# Patient Record
Sex: Male | Born: 1970
Health system: Southern US, Community
[De-identification: ages and names within clinical notes are randomized; demographics above are authoritative.]

## PROBLEM LIST (undated history)

## (undated) DIAGNOSIS — S83206A Unspecified tear of unspecified meniscus, current injury, right knee, initial encounter: Secondary | ICD-10-CM

## (undated) DIAGNOSIS — E782 Mixed hyperlipidemia: Secondary | ICD-10-CM

## (undated) DIAGNOSIS — E119 Type 2 diabetes mellitus without complications: Secondary | ICD-10-CM

## (undated) DIAGNOSIS — N529 Male erectile dysfunction, unspecified: Secondary | ICD-10-CM

## (undated) DIAGNOSIS — Z973 Presence of spectacles and contact lenses: Secondary | ICD-10-CM

## (undated) DIAGNOSIS — E7800 Pure hypercholesterolemia, unspecified: Secondary | ICD-10-CM

## (undated) HISTORY — PX: VASECTOMY: SHX75

## (undated) HISTORY — PX: NO PAST SURGERIES: SHX2092

## (undated) HISTORY — DX: Type 2 diabetes mellitus without complications: E11.9

## (undated) HISTORY — PX: POLYPECTOMY: SHX149

---
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
## 2014-04-22 ENCOUNTER — Emergency Department (HOSPITAL_COMMUNITY): Payer: No Typology Code available for payment source

## 2014-04-22 ENCOUNTER — Encounter (HOSPITAL_COMMUNITY): Payer: Self-pay | Admitting: Emergency Medicine

## 2014-04-22 ENCOUNTER — Emergency Department (HOSPITAL_COMMUNITY)
Admission: EM | Admit: 2014-04-22 | Discharge: 2014-04-22 | Disposition: A | Discharge status: Home / Self Care | Payer: No Typology Code available for payment source | Attending: Emergency Medicine | Admitting: Emergency Medicine

## 2014-04-22 DIAGNOSIS — R0789 Other chest pain: Secondary | ICD-10-CM | POA: Diagnosis not present

## 2014-04-22 DIAGNOSIS — Z862 Personal history of diseases of the blood and blood-forming organs and certain disorders involving the immune mechanism: Secondary | ICD-10-CM | POA: Diagnosis not present

## 2014-04-22 DIAGNOSIS — R079 Chest pain, unspecified: Secondary | ICD-10-CM | POA: Insufficient documentation

## 2014-04-22 DIAGNOSIS — Z8639 Personal history of other endocrine, nutritional and metabolic disease: Secondary | ICD-10-CM | POA: Diagnosis not present

## 2014-04-22 HISTORY — DX: Pure hypercholesterolemia, unspecified: E78.00

## 2014-04-22 LAB — BASIC METABOLIC PANEL
Anion gap: 13 (ref 5–15)
BUN: 15 mg/dL (ref 6–23)
CO2: 25 mEq/L (ref 19–32)
Calcium: 9.6 mg/dL (ref 8.4–10.5)
Chloride: 106 mEq/L (ref 96–112)
Creatinine, Ser: 1.06 mg/dL (ref 0.50–1.35)
GFR calc Af Amer: >90 mL/min (ref >90)
GFR calc non Af Amer: 85 mL/min — ABNORMAL LOW (ref >90)
Glucose, Bld: 111 mg/dL — ABNORMAL HIGH (ref 70–99)
Potassium: 4.3 mEq/L (ref 3.7–5.3)
Sodium: 144 mEq/L (ref 137–147)

## 2014-04-22 LAB — CBC
HCT: 44.9 % (ref 39.0–52.0)
Hemoglobin: 15.4 g/dL (ref 13.0–17.0)
MCH: 29.2 pg (ref 26.0–34.0)
MCHC: 34.3 g/dL (ref 30.0–36.0)
MCV: 85.2 fL (ref 78.0–100.0)
Platelets: 250 10*3/uL (ref 150–400)
RBC: 5.27 MIL/uL (ref 4.22–5.81)
RDW: 13.7 % (ref 11.5–15.5)
WBC: 12 10*3/uL — ABNORMAL HIGH (ref 4.0–10.5)

## 2014-04-22 LAB — I-STAT TROPONIN, ED: Troponin i, poc: 0 ng/mL (ref 0.00–0.08)

## 2014-04-22 LAB — TROPONIN I: Troponin I: <0.3 ng/mL (ref <0.30)

## 2014-04-22 LAB — PRO B NATRIURETIC PEPTIDE: Pro B Natriuretic peptide (BNP): <5 pg/mL (ref 0–125)

## 2014-04-22 MED ORDER — KETOROLAC TROMETHAMINE 30 MG/ML IJ SOLN
30.0000 mg | Freq: Once | INTRAMUSCULAR | Status: AC
  Administered 2014-04-22: 30 mg via INTRAMUSCULAR
  Filled 2014-04-22: qty 1

## 2014-04-22 NOTE — ED Notes (Signed)
Pt A&OX4, ambulatory at d/c with steady gait, NAD 

## 2014-04-22 NOTE — ED Provider Notes (Signed)
CSN: [id number]     Arrival date & time 04/22/14  1321 History   First MD Initiated Contact with Patient 04/22/14 1808     Chief Complaint  Patient presents with  . Chest Pain     (Consider location/radiation/quality/duration/timing/severity/associated sxs/prior Treatment) HPI  43yM with CP. Onset around 1100 today while sitting at his desk. Constant since. Pressure/tightness in center of chest and radiates up into neck. No appreciable exacerbating or relieving factors. No respiratory complaints. No fever or chills. No unusual leg pain or swelling. N/v. No hx of similar symptoms. No known cad. Never had stress test. Nonsmoker. Denies drug use.   Past Medical History  Diagnosis Date  . Elevated cholesterol    History reviewed. No pertinent past surgical history. No family history on file. History  Substance Use Topics  . Smoking status: Never Smoker   . Smokeless tobacco: Not on file  . Alcohol Use: No    Review of Systems  All systems reviewed and negative, other than as noted in HPI.   Allergies  Review of patient's allergies indicates no known allergies.  Home Medications   Prior to Admission medications   Not on File   BP 125/75  Pulse 86  Temp(Src) 98.1 F (36.7 C) (Oral)  Resp 14  SpO2 96% Physical Exam  Nursing note and vitals reviewed. Constitutional: He appears well-developed and well-nourished. No distress.  HENT:  Head: Normocephalic and atraumatic.  Eyes: Conjunctivae are normal. Right eye exhibits no discharge. Left eye exhibits no discharge.  Neck: Neck supple.  Cardiovascular: Normal rate, regular rhythm and normal heart sounds.  Exam reveals no gallop and no friction rub.   No murmur heard. Pulmonary/Chest: Effort normal and breath sounds normal. No respiratory distress. He exhibits no tenderness.  Abdominal: Soft. He exhibits no distension. There is no tenderness.  Musculoskeletal: He exhibits no edema and no tenderness.  Lower extremities  symmetric as compared to each other. No calf tenderness. Negative Homan's. No palpable cords.   Neurological: He is alert.  Skin: Skin is warm and dry.  Psychiatric: He has a normal mood and affect. His behavior is normal. Thought content normal.    ED Course  Procedures (including critical care time) Labs Review Labs Reviewed  CBC - Abnormal; Notable for the following:    WBC 12.0 (*)    All other components within normal limits  BASIC METABOLIC PANEL - Abnormal; Notable for the following:    Glucose, Bld 111 (*)    GFR calc non Af Amer 85 (*)    All other components within normal limits  PRO B NATRIURETIC PEPTIDE  I-STAT TROPOININ, ED    Imaging Review Dg Chest 2 View  04/22/2014   CLINICAL DATA:  Chest heaviness and shortness of breath.  EXAM: CHEST  2 VIEW  COMPARISON:  None.  FINDINGS: Heart size and mediastinal contours are within normal limits. Both lungs are clear. Visualized skeletal structures are unremarkable.  IMPRESSION: Negative exam.   Electronically Signed   By: Inge Rise M.D.   On: 04/22/2014 14:10     EKG Interpretation   Date/Time:  Thursday April 22 2014 13:24:41 EDT Ventricular Rate:  87 PR Interval:  172 QRS Duration: 92 QT Interval:  338 QTC Calculation: 406 R Axis:   94 Text Interpretation:  Normal sinus rhythm Rightward axis Nonspecific ST  and T wave abnormality Abnormal ECG Confirmed by Wilson Singer  MD, Laisha Rau ([id number])  on 04/22/2014 6:12:33 PM      MDM  Final diagnoses:  Chest pain, unspecified chest pain type    43 year old male with chest pain. Mostly atypical symptoms. Pain into neck and just somewhat concerning, but otherwise symptoms are atypical. Onset at rest. Has  been constant since onset. Exacerbated by movement. No SOB, nausea, diaphoresis. EKG with nonspecific changes. No old for comparison. No known hx of CAD. No risk factors aside from HLD. HEART Score is two. Will check a second troponin. Doubt dissection or PE. CXR  clear. If second troponin, drown ~7 hours after symptoms onset is normal, plan DC with outpt FU for stress testing.     Virgel Manifold, MD 04/24/14 [phone]

## 2014-04-22 NOTE — ED Notes (Signed)
Updated pt; pt sts still having pain into neck; VS reeval

## 2014-04-22 NOTE — Discharge Instructions (Signed)
Chest Pain (Nonspecific) °It is often hard to give a specific diagnosis for the cause of chest pain. There is always a chance that your pain could be related to something serious, such as a heart attack or a blood clot in the lungs. You need to follow up with your health care provider for further evaluation. °CAUSES  °· Heartburn. °· Pneumonia or bronchitis. °· Anxiety or stress. °· Inflammation around your heart (pericarditis) or lung (pleuritis or pleurisy). °· A blood clot in the lung. °· A collapsed lung (pneumothorax). It can develop suddenly on its own (spontaneous pneumothorax) or from trauma to the chest. °· Shingles infection (herpes zoster virus). °The chest wall is composed of bones, muscles, and cartilage. Any of these can be the source of the pain. °· The bones can be bruised by injury. °· The muscles or cartilage can be strained by coughing or overwork. °· The cartilage can be affected by inflammation and become sore (costochondritis). °DIAGNOSIS  °Lab tests or other studies may be needed to find the cause of your pain. Your health care provider may have you take a test called an ambulatory electrocardiogram (ECG). An ECG records your heartbeat patterns over a 24-hour period. You may also have other tests, such as: °· Transthoracic echocardiogram (TTE). During echocardiography, sound waves are used to evaluate how blood flows through your heart. °· Transesophageal echocardiogram (TEE). °· Cardiac monitoring. This allows your health care provider to monitor your heart rate and rhythm in real time. °· Holter monitor. This is a portable device that records your heartbeat and can help diagnose heart arrhythmias. It allows your health care provider to track your heart activity for several days, if needed. °· Stress tests by exercise or by giving medicine that makes the heart beat faster. °TREATMENT  °· Treatment depends on what may be causing your chest pain. Treatment may include: °¨ Acid blockers for  heartburn. °¨ Anti-inflammatory medicine. °¨ Pain medicine for inflammatory conditions. °¨ Antibiotics if an infection is present. °· You may be advised to change lifestyle habits. This includes stopping smoking and avoiding alcohol, caffeine, and chocolate. °· You may be advised to keep your head raised (elevated) when sleeping. This reduces the chance of acid going backward from your stomach into your esophagus. °Most of the time, nonspecific chest pain will improve within 2-3 days with rest and mild pain medicine.  °HOME CARE INSTRUCTIONS  °· If antibiotics were prescribed, take them as directed. Finish them even if you start to feel better. °· For the next few days, avoid physical activities that bring on chest pain. Continue physical activities as directed. °· Do not use any tobacco products, including cigarettes, chewing tobacco, or electronic cigarettes. °· Avoid drinking alcohol. °· Only take medicine as directed by your health care provider. °· Follow your health care provider's suggestions for further testing if your chest pain does not go away. °· Keep any follow-up appointments you made. If you do not go to an appointment, you could develop lasting (chronic) problems with pain. If there is any problem keeping an appointment, call to reschedule. °SEEK MEDICAL CARE IF:  °· Your chest pain does not go away, even after treatment. °· You have a rash with blisters on your chest. °· You have a fever. °SEEK IMMEDIATE MEDICAL CARE IF:  °· You have increased chest pain or pain that spreads to your arm, neck, jaw, back, or abdomen. °· You have shortness of breath. °· You have an increasing cough, or you cough   up blood. °· You have severe back or abdominal pain. °· You feel nauseous or vomit. °· You have severe weakness. °· You faint. °· You have chills. °This is an emergency. Do not wait to see if the pain will go away. Get medical help at once. Call your local emergency services (911 in [place] Do not drive  yourself to the hospital. °MAKE SURE YOU:  °· Understand these instructions. °· Will watch your condition. °· Will get help right away if you are not doing well or get worse. °Document Released: 04/25/2005 Document Revised: 07/21/2013 Document Reviewed: 02/19/2008 °ExitCare® Patient Information ©2015 [employer]. This information is not intended to replace advice given to you by your health care provider. Make sure you discuss any questions you have with your health care provider. ° ° °Emergency Department Resource Guide °1) Find a Doctor and Pay Out of Pocket °Although you won't have to find out who is covered by your insurance plan, it is a good idea to ask around and get recommendations. You will then need to call the office and see if the doctor you have chosen will accept you as a new patient and what types of options they offer for patients who are self-pay. Some doctors offer discounts or will set up payment plans for their patients who do not have insurance, but you will need to ask so you aren't surprised when you get to your appointment. ° °2) Contact Your Local Health Department °Not all health departments have doctors that can see patients for sick visits, but many do, so it is worth a call to see if yours does. If you don't know where your local health department is, you can check in your phone book. The CDC also has a tool to help you locate your state's health department, and many state websites also have listings of all of their local health departments. ° °3) Find a Walk-in Clinic °If your illness is not likely to be very severe or complicated, you may want to try a walk in clinic. These are popping up all over the country in pharmacies, drugstores, and shopping centers. They're usually staffed by nurse practitioners or physician assistants that have been trained to treat common illnesses and complaints. They're usually fairly quick and inexpensive. However, if you have serious medical issues or  chronic medical problems, these are probably not your best option. ° °No Primary Care Doctor: °- Call [facility] at  832-8000 - they can help you locate a primary care doctor that  accepts your insurance, provides certain services, etc. °- Physician Referral Service- 1-800-533-3463 ° °Chronic Pain Problems: °Organization         Address  Phone   Notes  °[facility]  (336) 297-2271 Patients need to be referred by their primary care doctor.  ° °Medication Assistance: °Organization         Address  Phone   Notes  °[place] [facility] [address] °[place], [place] [address] (336) 641-8030 --Must be a resident of [place] °-- Must have NO insurance coverage whatsoever (no Medicaid/ Medicare, etc.) °-- The pt. MUST have a primary care doctor that directs their care regularly and follows them in the community °  °[facility]  (866) 331-1348   °[employer]  (888) 892-1162   ° °Agencies that provide inexpensive medical care: °Organization         Address  Phone   Notes  °[facility]  (  336) 832-8035   °[facility]    (336) 832-7272   °[facility] [address] °[place], [place] [address] (336) 832-4777   °[facility] [address], °[place] (336) 271-4999   °[facility]    (336) 373-0678   °[facility]    (336) 272-1050   °[facility] ° [address], [place] Phone:  (336) 832-4444, Fax:  (336) 832-4440 Hours of Operation:  9 am - 6 pm, M-F.  Also accepts Medicaid/Medicare and self-pay.  °[facility] ° [address], [place] Phone: (336) 832-3150, Fax: (336) 832-3151. Hours of Operation:  8:30 am - 5:30 pm, M-F.  Also accepts Medicaid and self-pay.  °[facility] [place] [address], [place] Phone: (336) 878-6027   °[facility] [address], [place], [name]  (336)723-1848, Ext. 123 Mondays & Thursdays: 7-9 AM.  First 15 patients are seen on a first come, first serve basis. °  ° °Medicaid-accepting [place] Providers: ° °Organization         Address  Phone   Notes  °[facility] [address], [place] (336) 641-2100 Also accepts self-pay patients.  °[facility] [address], [place] ° (336) 856-9996   °[facility] [address], [place] (336) 288-8857   °[facility] [address] [place] [address], [place] (336) 299-7000   °Veita Bland [address], [place]  ° (336) 373-1557 Only accepts [place] Access Medicaid patients after they have their name applied to their card.  ° °Self-Pay (no insurance) in [place]: ° °Organization         Address  Phone   Notes  °Sickle Cell Patients, [facility] [address], [place] (336) 832-1970   °[facility] [address], [place] (336) 832-4400   °[facility] ° [address], [place] (336) 992-4800   °[facility]/Dr. Osei-Bonsu ° [address] [place] [address], [place] or [address], [place] (336) 841-8500 Phone number for both [place] and [place] locations is the same.  °[facility] [address], [place] (336) 299-0000   °[facility] [address] [place] [address], [place] or [address] (336) 852-7530 °(336) 878-2260   °[facility] [address], [place] (336) 350-1642, phone; (336) 294-5005, fax Sees patients 1st and 3rd Saturday of every month.  Must not qualify for public or private insurance (i.e. Medicaid, Medicare, [place] Health Choice, Veterans' Benefits) • Household income should be no more than 200% of the poverty level •The clinic cannot treat you if you are pregnant or think you are pregnant • Sexually transmitted  diseases are not treated at the clinic.  ° ° °Dental Care: °Organization         Address  Phone  Notes  °[facility] [address], [place] (336) 641-6152 Accepts children up to age 21 who are enrolled in Medicaid or [place] Health Choice; pregnant women with a Medicaid card; and children who have applied for Medicaid or [place] Health Choice, but were declined, whose parents can pay a reduced fee at time of service.  °[facility] [place]    [address], [place] (336) 641-7733 Accepts children up to age 21 who are enrolled in Medicaid or [place] Health Choice; pregnant women with a Medicaid card; and children who have applied for Medicaid or [place] Health Choice, but were declined, whose parents can pay a reduced fee at time of service.  °[facility] ° [address], [place] (336) 641-4533 Patients are seen by appointment only. Walk-ins are not accepted. [facility] will see patients 18 years of age and older. °Monday - Tuesday (8am-5pm) °Most Wednesdays (8:30-5pm) °$30 per visit, cash only  °[facility] ° [address], [place] (336) 641-4533 Patients are seen by appointment only. Walk-ins are not accepted. [facility] will see patients 18 years of age and older. °One Wednesday Evening (Monthly: Volunteer Based).  $30 per visit, cash only  °[facility]  (919) 537-3737 for adults; Children under age 4, call Graduate Pediatric Dentistry at ([phone]) [phone]. Children aged 4-14, please call (919) [phone] to request a pediatric application. ° Dental services are provided in all areas of dental care including fillings, crowns and bridges, complete and partial dentures, implants, gum treatment, root canals, and extractions. Preventive care is also provided. Treatment is provided to both adults and children. °Patients are selected via a  lottery and there is often a waiting list. °  °[facility] [address], °[place] ° (336) 763-8833 [email or web address] °  °[facility] [address], [place], [place] (336)723-1848, Ext. 123 Second and Fourth Thursday of each month, opens at 6:30 AM; Clinic ends at 9 AM.  Patients are seen on a first-come first-served basis, and a limited number are seen during each clinic.  ° °[facility] ° [address], [place], [place] (336) 723-7904   Eligibility Requirements °You must have lived in [place], [place], or [place] counties for at least the last three months. °  You cannot be eligible for state or federal sponsored healthcare insurance, including Veterans Administration, Medicaid, or Medicare. °  You generally cannot be eligible for healthcare insurance through your employer.  °  How to apply: °Eligibility screenings are held every Tuesday and Wednesday afternoon from 1:00 pm until 4:00 pm. You do not need an appointment for the interview!  °[facility] [address], [place], [place] 336-631-2330   °[facility]  336-342-8273   °[facility]  336-703-3100   °[facility]  336-570-6415   ° °Behavioral Health Resources in the Community: °Intensive Outpatient Programs °Organization         Address  Phone  Notes  °[facility] [address], [place], [place] 336-878-6098   °[facility] [address], [place], [place] 336-832-9800   °[facility] [address], [place], [place] ° 336-882-2125   °[facility] [address],  °[place], [place] 1-800-853-5163 or 336-641-4981   °Substance Abuse Resources °Organization         Address  Phone  Notes  °[facility]  336-882-2125   °[facility]  336-784-9470   °[facility] [place] [facility]  336-285-9073   °[facility]  336-845-3988   °Residential &  Outpatient Substance Abuse Program  1-800-659-3381   °Psychological Services °Organization         Address  Phone  Notes  °[facility]  336- 832-9600   °  [facility]  336- 378-7881   °[facility] [address], [place] 1-800-853-5163 or 336-641-4981   ° °Mobile Crisis Teams °Organization         Address  Phone  Notes  °[facility], Mobile Crisis Care Unit  1-877-626-1772   °Assertive °[facility] ° [address] [place], [place] 336-834-9664   °Sharon DeEsch [address] °[place] 336-554-5454   ° °Self-Help/Support Groups °Organization         Address  Phone             Notes  °[facility] - variety of support groups  336- 373-1402 Call for more information  °Narcotics Anonymous (NA), [facility] [address], °[place]  2 meetings at this location  ° °Residential Treatment Programs °Organization         Address  Phone  Notes  °[facility] [address],    °[place]  1-866-801-8205   °[facility] ° [address], [place], [place] 704-293-8524   °[facility] [address], [place] 336-845-3988 Admissions: 8am-3pm M-F  °[facility] [address]    °[place], [place] 336-841-1104   °[facility] [address], [place], [place] 336-379-7146   °[facility] [address]  °[place], [place] 336-285-9073   °[facility] Intensive Outpatient [address], [place], [place] 336-852-3033   °[facility] [address]  °[place], [place] 1-877-615-2722 or 336-784-9470   °[facility] [address] [place], [place] 336-227-7417 Accepts Medicaid  °[facility] [address]  °[place] 1-800-659-3381 Substance Abuse/Addiction Treatment  ° °[place] Behavioral Health Resources °Organization          Address  Phone  Notes  °[facility]  (888) 581-9988   °Julie Brannon, PhD [address] [place], [place]   (336) 349-5553 or (336) 951-0000   °[facility]   [address] °[place], [place] (336) 349-4454   °[facility] [address], [place], [place] (336) 342-8316 Insurance/Medicaid/sponsorship through [facility]  °[facility] [address]                                    [place], [place] (336) 342-8316 Therapy/tele-psych/case  °[facility] [address]  ° [place], [place] (336) 349-2233    °Dr. Arfeen  (336) 349-4544   °[facility] [place]  [employer] [place] [facility] 1) [address], [place] °2) [address], [place] °3)  [address], [place] (336) 349-3220 °(336) 342-7768 ° °(336) 342-8140   °[place] [facility] (336) 342-1394 or (336) 342-3537 (After Hours)    ° ° ° °

## 2014-04-22 NOTE — ED Notes (Signed)
Pt states that he was sitting at his desk when he began having chest and jaw pain. Pt states that it feels like he has a "knot in the center of his chest" pain is worse with movement. Reports some dizziness.

## 2016-06-12 DIAGNOSIS — H35363 Drusen (degenerative) of macula, bilateral: Secondary | ICD-10-CM | POA: Diagnosis not present

## 2017-03-31 DIAGNOSIS — S6992XA Unspecified injury of left wrist, hand and finger(s), initial encounter: Secondary | ICD-10-CM | POA: Diagnosis not present

## 2017-03-31 DIAGNOSIS — M25532 Pain in left wrist: Secondary | ICD-10-CM | POA: Diagnosis not present

## 2017-03-31 DIAGNOSIS — S63502A Unspecified sprain of left wrist, initial encounter: Secondary | ICD-10-CM | POA: Diagnosis not present

## 2017-07-03 DIAGNOSIS — E782 Mixed hyperlipidemia: Secondary | ICD-10-CM | POA: Diagnosis not present

## 2017-07-03 DIAGNOSIS — E559 Vitamin D deficiency, unspecified: Secondary | ICD-10-CM | POA: Diagnosis not present

## 2017-07-15 DIAGNOSIS — H35363 Drusen (degenerative) of macula, bilateral: Secondary | ICD-10-CM | POA: Diagnosis not present

## 2017-10-11 ENCOUNTER — Telehealth: Payer: Self-pay | Admitting: General Practice

## 2017-10-11 NOTE — Telephone Encounter (Signed)
Called pt and LVM regarding his request to establish care with our practice. Informed him that we do have providers that are accepting new patients and advised that he call back to make a new patient appt.

## 2017-10-21 ENCOUNTER — Ambulatory Visit (INDEPENDENT_AMBULATORY_CARE_PROVIDER_SITE_OTHER): Payer: BLUE CROSS/BLUE SHIELD | Admitting: Family Medicine

## 2017-10-21 ENCOUNTER — Encounter: Payer: Self-pay | Admitting: Family Medicine

## 2017-10-21 VITALS — BP 104/68 | HR 73 | Temp 97.7°F | Ht 70.5 in | Wt 210.5 lb

## 2017-10-21 DIAGNOSIS — R1013 Epigastric pain: Secondary | ICD-10-CM

## 2017-10-21 MED ORDER — OMEPRAZOLE 20 MG PO CPDR: 20.0000 mg | DELAYED_RELEASE_CAPSULE | Freq: Two times a day (BID) | ORAL | 1 refills | Status: DC

## 2017-10-21 NOTE — Progress Notes (Signed)
Chief Complaint  Patient presents with  . Establish Care       New Patient Visit SUBJECTIVE: HPI: Jeremy Pierce is an 47 y.o.male who is being seen for establishing care.  The patient was previously seen at   Over past year or so, he has been having L sided discomfort under ribcage that radiates up and down. A/w belching. Describes as a "knot", not pain. Usually gets it after he eats or laying down. Does not have it right now. Denies nighttime awakenings, fevers, N/V/D, bleeding, or unintentional weight loss.Pt has not tried anything for this. Exertion does not affect this issue.   No Known Allergies  Past Medical History:  Diagnosis Date  . Elevated cholesterol    Past Surgical History:  Procedure Laterality Date  . NO PAST SURGERIES     Social History   Socioeconomic History  . Marital status: Divorced  Tobacco Use  . Smoking status: Former Research scientist (life sciences)  . Smokeless tobacco: Never Used  Substance and Sexual Activity  . Alcohol use: No    Comment: drinks beer--very seldom though  . Drug use: Never   Family History  Problem Relation Age of Onset  . Cancer Mother        breast cancer and tongue cancer  . Kidney disease Father   . Heart disease Father   . Cancer Sister        breast     Current Outpatient Medications:  Marland Kitchen  Multiple Vitamins-Minerals (MULTIVITAMIN WITH MINERALS) tablet, Take 1 tablet by mouth daily., Disp: , Rfl:  .  Omega-3 Fatty Acids (FISH OIL PO), Take 2 capsules by mouth daily., Disp: , Rfl:  .  Vitamin D, Ergocalciferol, (DRISDOL) 50000 UNITS CAPS capsule, Take 50,000 Units by mouth every 7 (seven) days. Mondays, Disp: , Rfl:  .  omeprazole (PRILOSEC) 20 MG capsule, Take 1 capsule (20 mg total) by mouth 2 (two) times daily before a meal., Disp: 60 capsule, Rfl: 1 .  simvastatin (ZOCOR) 20 MG tablet, Take 1 tablet (20 mg total) by mouth at bedtime., Disp: 90 tablet, Rfl: 3  ROS Const: Denies unintentional wt loss  GI: As noted in HPI    OBJECTIVE: BP 104/68 (BP Location: Left Arm, Patient Position: Sitting, Cuff Size: Large)   Pulse 73   Temp 97.7 F (36.5 C) (Oral)   Ht 5' 10.5" (1.791 m)   Wt 210 lb 8 oz (95.5 kg)   SpO2 96%   BMI 29.78 kg/m   Constitutional: -  VS reviewed -  Well developed, well nourished, appears stated age -  No apparent distress  Psychiatric: -  Oriented to person, place, and time -  Memory intact -  Affect and mood normal -  Fluent conversation, good eye contact -  Judgment and insight age appropriate  Eye: -  Conjunctivae clear, no discharge -  Pupils symmetric, round, reactive to light  ENMT: -  MMM    Pharynx moist, no exudate, no erythema  Neck: -  No gross swelling, no palpable masses -  Thyroid midline, not enlarged, mobile, no palpable masses  Cardiovascular: -  RRR -  No LE edema  Respiratory: -  Normal respiratory effort, no accessory muscle use, no retraction -  Breath sounds equal, no wheezes, no ronchi, no crackles  Gastrointestinal: -  Bowel sounds normal -  No tenderness, no distention, no guarding, no masses -  Neg Murphy's, Rovsing's, McBurnye's, Carnett's  Skin: -  No significant lesion on inspection -  Warm and  dry to palpation   ASSESSMENT/PLAN: Abdominal discomfort, epigastric - Plan: omeprazole (PRILOSEC) 20 MG capsule  Patient instructed to sign release of records form from his previous PCP. Trial 2 mo of PPI, if no improvement over next 3-4 weeks, will send MyChart message and I will refer to GI. Patient should return 3 mo for CPE. The patient voiced understanding and agreement to the plan.   [place], DO 10/21/17  11:02 AM

## 2017-10-21 NOTE — Patient Instructions (Addendum)
Take medicine for 60 days. Let me know in around 3 weeks if things are not improved. We will send you to a specialist at that point.  The only lifestyle changes that have data behind them are weight loss for the overweight/obese and elevating the head of the bed. Finding out which foods/positions are triggers is important.  Let us know if you need anything.

## 2017-10-21 NOTE — Progress Notes (Signed)
Pre visit review using our clinic review tool, if applicable. No additional management support is needed unless otherwise documented below in the visit note. 

## 2017-11-06 ENCOUNTER — Encounter: Payer: Self-pay | Admitting: Family Medicine

## 2017-11-09 ENCOUNTER — Other Ambulatory Visit: Payer: Self-pay | Admitting: Family Medicine

## 2017-11-09 MED ORDER — PANTOPRAZOLE SODIUM 40 MG PO TBEC: 40.0000 mg | DELAYED_RELEASE_TABLET | Freq: Every day | ORAL | 3 refills | Status: DC

## 2017-12-30 ENCOUNTER — Ambulatory Visit (INDEPENDENT_AMBULATORY_CARE_PROVIDER_SITE_OTHER): Payer: BLUE CROSS/BLUE SHIELD | Admitting: Family Medicine

## 2017-12-30 ENCOUNTER — Encounter: Payer: Self-pay | Admitting: Family Medicine

## 2017-12-30 VITALS — BP 108/68 | HR 67 | Temp 98.2°F | Ht 70.5 in | Wt 207.4 lb

## 2017-12-30 DIAGNOSIS — Z0000 Encounter for general adult medical examination without abnormal findings: Secondary | ICD-10-CM

## 2017-12-30 DIAGNOSIS — Z114 Encounter for screening for human immunodeficiency virus [HIV]: Secondary | ICD-10-CM | POA: Diagnosis not present

## 2017-12-30 DIAGNOSIS — Z125 Encounter for screening for malignant neoplasm of prostate: Secondary | ICD-10-CM

## 2017-12-30 DIAGNOSIS — L989 Disorder of the skin and subcutaneous tissue, unspecified: Secondary | ICD-10-CM | POA: Diagnosis not present

## 2017-12-30 LAB — COMPREHENSIVE METABOLIC PANEL
ALT: 30 U/L (ref 0–53)
AST: 20 U/L (ref 0–37)
Albumin: 4.5 g/dL (ref 3.5–5.2)
Alkaline Phosphatase: 58 U/L (ref 39–117)
BILIRUBIN TOTAL: 0.4 mg/dL (ref 0.2–1.2)
BUN: 17 mg/dL (ref 6–23)
CO2: 27 mEq/L (ref 19–32)
Calcium: 9.7 mg/dL (ref 8.4–10.5)
Chloride: 106 mEq/L (ref 96–112)
Creatinine, Ser: 1.27 mg/dL (ref 0.40–1.50)
GFR: 64.78 mL/min (ref >60.00)
GLUCOSE: 108 mg/dL — AB (ref 70–99)
Potassium: 4.7 mEq/L (ref 3.5–5.1)
Sodium: 140 mEq/L (ref 135–145)
Total Protein: 6.8 g/dL (ref 6.0–8.3)

## 2017-12-30 LAB — PSA: PSA: 0.52 ng/mL (ref 0.10–4.00)

## 2017-12-30 LAB — LIPID PANEL
Cholesterol: 155 mg/dL (ref 0–200)
HDL: 28.2 mg/dL — AB (ref >39.00)
NONHDL: 127.14
Total CHOL/HDL Ratio: 6
Triglycerides: 313 mg/dL — ABNORMAL HIGH (ref 0.0–149.0)
VLDL: 62.6 mg/dL — AB (ref 0.0–40.0)

## 2017-12-30 LAB — LDL CHOLESTEROL, DIRECT: Direct LDL: 78 mg/dL

## 2017-12-30 NOTE — Patient Instructions (Addendum)
Keep the diet clean and stay active.  2-3 days to get the results of your labs back.   Pepcid 20 mg is an alternative to Protonix and may be less strong.  Cancel appointment in 2 weeks if the skin lesion is doing better.  Let us know if you need anything.

## 2017-12-30 NOTE — Progress Notes (Signed)
Pre visit review using our clinic review tool, if applicable. No additional management support is needed unless otherwise documented below in the visit note. 

## 2017-12-30 NOTE — Progress Notes (Signed)
Chief Complaint  Patient presents with  . Annual Exam    Well Male Jeremy Pierce is here for a complete physical.   His last physical was >1 year ago.  Current diet: in general, a "healthy" diet.   Current exercise: [place] [employer] Weight trend: losing wt  steadily Does pt snore? Sometimes. Daytime fatigue? No. Seat belt? Yes.    Health maintenance Tetanus- Yes  HIV- No  Past Medical History:  Diagnosis Date  . Elevated cholesterol      Past Surgical History:  Procedure Laterality Date  . NO PAST SURGERIES      Medications  Current Outpatient Medications on File Prior to Visit  Medication Sig Dispense Refill  . Multiple Vitamins-Minerals (MULTIVITAMIN WITH MINERALS) tablet Take 1 tablet by mouth daily.    . Omega-3 Fatty Acids (FISH OIL PO) Take 2 capsules by mouth daily.    . pantoprazole (PROTONIX) 40 MG tablet Take 1 tablet (40 mg total) by mouth daily. 30 tablet 3  . simvastatin (ZOCOR) 20 MG tablet Take 1 tablet (20 mg total) by mouth at bedtime. 90 tablet 3  . Vitamin D, Ergocalciferol, (DRISDOL) 50000 UNITS CAPS capsule Take 50,000 Units by mouth every 7 (seven) days. Mondays     Allergies No Known Allergies  Family History Family History  Problem Relation Age of Onset  . Cancer Mother        breast cancer and tongue cancer  . Kidney disease Father   . Heart disease Father   . Cancer Sister        breast    Review of Systems: Constitutional: no fevers or chills Eye:  no recent significant change in vision- steadily losing vision, follows w [facility] Ear/Nose/Mouth/Throat:  Ears: +progressive hearing loss Nose/Mouth/Throat:  no complaints of nasal congestion, no sore throat Cardiovascular:  no chest pain, no palpitations Respiratory:  no cough and no shortness of breath Gastrointestinal:  no abdominal pain, no change in bowel habits GU:  Male: negative for dysuria, frequency, and incontinence and negative for prostate  symptoms Musculoskeletal/Extremities:  no pain, redness, or swelling of the joints Integumentary (Skin/Breast): +skin lesion on back (noticed for 4 days); otherwise no abnormal skin lesions reported Neurologic:  no headaches, no numbness, tingling Endocrine: No unexpected weight changes Hematologic/Lymphatic:  no night sweats  Exam BP 108/68 (BP Location: Left Arm, Patient Position: Sitting, Cuff Size: Large)   Pulse 67   Temp 98.2 F (36.8 C) (Oral)   Ht 5' 10.5" (1.791 m)   Wt 207 lb 6 oz (94.1 kg)   SpO2 95%   BMI 29.33 kg/m  General:  well developed, well nourished, in no apparent distress Skin: Center of thoracolumbar spine area, there is a 0.5 x 0.6 cm raised and thickened lesion with central ecchymosis; otherwise no significant moles, warts, or growths Head:  no masses, lesions, or tenderness Eyes:  pupils equal and round, sclera anicteric without injection Ears:  canals without lesions, TMs shiny without retraction, no obvious effusion, no erythema Nose:  nares patent, septum midline, mucosa normal Throat/Pharynx:  lips and gingiva without lesion; tongue and uvula midline; non-inflamed pharynx; no exudates or postnasal drainage Neck: neck supple without adenopathy, thyromegaly, or masses Lungs:  clear to auscultation, breath sounds equal bilaterally, no respiratory distress Cardio:  regular rate and rhythm, no bruits, no LE edema Abdomen:  abdomen soft, nontender; bowel sounds normal; no masses or organomegaly Genital (male): circumcised penis, no lesions or discharge; testes present bilaterally without masses or  tenderness Rectal: Deferred Musculoskeletal:  symmetrical muscle groups noted without atrophy or deformity Extremities:  no clubbing, cyanosis, or edema, no deformities, no skin discoloration Neuro:  gait normal; deep tendon reflexes normal and symmetric Psych: well oriented with normal range of affect and appropriate judgment/insight  Assessment and Plan  Well  adult exam - Plan: Comprehensive metabolic panel, Lipid panel  Screening for HIV (human immunodeficiency virus) - Plan: HIV antibody  Skin lesion  Screening for prostate cancer - Plan: PSA   Well 47 y.o. male. Counseled on diet and exercise. Other orders as above. Will request records from [facility].  Follow up in 6 mo for med check, 2 weeks for lesion removal. If it gets better, cancel appt. The patient voiced understanding and agreement to the plan.  [place], DO 12/30/17 10:40 AM

## 2017-12-31 ENCOUNTER — Encounter: Payer: Self-pay | Admitting: Family Medicine

## 2017-12-31 LAB — HIV ANTIBODY (ROUTINE TESTING W REFLEX): HIV: NONREACTIVE

## 2018-01-01 ENCOUNTER — Other Ambulatory Visit: Payer: Self-pay | Admitting: Family Medicine

## 2018-01-01 DIAGNOSIS — E785 Hyperlipidemia, unspecified: Secondary | ICD-10-CM

## 2018-01-01 DIAGNOSIS — R739 Hyperglycemia, unspecified: Secondary | ICD-10-CM

## 2018-01-03 ENCOUNTER — Other Ambulatory Visit: Payer: BLUE CROSS/BLUE SHIELD

## 2018-01-06 ENCOUNTER — Other Ambulatory Visit (INDEPENDENT_AMBULATORY_CARE_PROVIDER_SITE_OTHER): Payer: BLUE CROSS/BLUE SHIELD

## 2018-01-06 DIAGNOSIS — R739 Hyperglycemia, unspecified: Secondary | ICD-10-CM

## 2018-01-06 DIAGNOSIS — E785 Hyperlipidemia, unspecified: Secondary | ICD-10-CM

## 2018-01-06 LAB — LIPID PANEL
Cholesterol: 138 mg/dL (ref 0–200)
HDL: 27.8 mg/dL — ABNORMAL LOW (ref >39.00)
NONHDL: 110.58
Total CHOL/HDL Ratio: 5
Triglycerides: 206 mg/dL — ABNORMAL HIGH (ref 0.0–149.0)
VLDL: 41.2 mg/dL — ABNORMAL HIGH (ref 0.0–40.0)

## 2018-01-06 LAB — LDL CHOLESTEROL, DIRECT: Direct LDL: 79 mg/dL

## 2018-01-06 LAB — HEMOGLOBIN A1C: Hgb A1c MFr Bld: 6.4 % (ref 4.6–6.5)

## 2018-01-13 ENCOUNTER — Ambulatory Visit (INDEPENDENT_AMBULATORY_CARE_PROVIDER_SITE_OTHER): Payer: BLUE CROSS/BLUE SHIELD | Admitting: Family Medicine

## 2018-01-13 ENCOUNTER — Encounter: Payer: Self-pay | Admitting: Family Medicine

## 2018-01-13 VITALS — BP 108/62 | HR 76 | Temp 98.3°F | Ht 70.5 in | Wt 208.2 lb

## 2018-01-13 DIAGNOSIS — D489 Neoplasm of uncertain behavior, unspecified: Secondary | ICD-10-CM

## 2018-01-13 DIAGNOSIS — L820 Inflamed seborrheic keratosis: Secondary | ICD-10-CM | POA: Diagnosis not present

## 2018-01-13 MED ORDER — VITAMIN D (ERGOCALCIFEROL) 1.25 MG (50000 UNIT) PO CAPS
50000.0000 [IU] | ORAL_CAPSULE | ORAL | 0 refills | Status: DC
Start: 2018-01-13 — End: 2019-01-02

## 2018-01-13 MED ORDER — SIMVASTATIN 20 MG PO TABS: 20.0000 mg | ORAL_TABLET | Freq: Every day | ORAL | 3 refills | Status: DC

## 2018-01-13 NOTE — Progress Notes (Signed)
Pre visit review using our clinic review tool, if applicable. No additional management support is needed unless otherwise documented below in the visit note. 

## 2018-01-13 NOTE — Patient Instructions (Signed)
If your skin lesion is still there on your scalp in the next couple months, it needs to evaluated/come off.  Do not shower for the rest of the day. When you do wash it, use only soap and water. Do not vigorously scrub. Apply triple antibiotic ointment (like Neosporin) twice daily. Keep the area clean and dry.   Things to look out for: increasing pain not relieved by ibuprofen/acetaminophen, fevers, spreading redness, drainage of pus, or foul odor.  Give [place] 1 business week to get the results of your biopsy back.   Let us know if you need anything.

## 2018-01-13 NOTE — Progress Notes (Signed)
Chief Complaint  Patient presents with  . Procedure    Pt here for skin procedure. Noticed lesion on back, brought up at CPE. Has not changed, plan to remove. It scales over, improves and gets worse again.  BP 108/62 (BP Location: Left Arm, Patient Position: Sitting, Cuff Size: Large)   Pulse 76   Temp 98.3 F (36.8 C) (Oral)   Ht 5' 10.5" (1.791 m)   Wt 208 lb 4 oz (94.5 kg)   SpO2 96%   BMI 29.46 kg/m  Gen- awake, alert Skin- See below Psych- Age appropriate judgment and insight    Scalp    L thoracolumbar spine  Procedure note; shave biopsy (0.6 x 0.4 cm) Informed consent was obtained. The area was cleaned with alcohol and injected with 0.6 mL of 1% lidocaine with epinephrine. A Dermablade was slightly bent and used to cut under the area of interest. The specimen was placed in a sterile specimen cup and sent to the lab. The area was then cauterized ensuring adequate hemostasis. The area was dressed with triple antibiotic ointment and a bandage. There were no complications noted. The patient tolerated the procedure well.  Neoplasm of uncertain behavior - Plan: PR SHAV SKIN LES 0.6-1.0 CM TRUNK,ARM,LEG  Orders as above. Aftercare instructions verbalized and written. If lesion on scalp does not resolve in next couple mo, I want to see him or have him see a dermatologist. F/u as originally scheduled otherwise. Pt voiced understanding and agreement to the plan.  Jeremy Pierce 4:53 PM 01/13/18

## 2018-01-17 ENCOUNTER — Telehealth: Payer: Self-pay | Admitting: *Deleted

## 2018-01-17 NOTE — Telephone Encounter (Signed)
Received Dermatopathology Report results from [facility]; forwarded to provider/SLS 06/21

## 2018-02-07 ENCOUNTER — Ambulatory Visit (INDEPENDENT_AMBULATORY_CARE_PROVIDER_SITE_OTHER): Payer: BLUE CROSS/BLUE SHIELD | Admitting: Family Medicine

## 2018-02-07 ENCOUNTER — Encounter: Payer: Self-pay | Admitting: Family Medicine

## 2018-02-07 VITALS — BP 100/72 | HR 72 | Temp 98.1°F | Ht 70.5 in | Wt 209.0 lb

## 2018-02-07 DIAGNOSIS — N509 Disorder of male genital organs, unspecified: Secondary | ICD-10-CM | POA: Diagnosis not present

## 2018-02-07 DIAGNOSIS — N5089 Other specified disorders of the male genital organs: Secondary | ICD-10-CM

## 2018-02-07 NOTE — Progress Notes (Signed)
Chief Complaint  Patient presents with  . Testicle Pain    c/o hard lump on left testicle since this past Sunday.     Subjective: Patient is a 47 y.o. male here for lump on L testicle.  Noticed it 5 d ago. Might have gotten a little smaller today. No injury or pain. No urination issues. No hx of testicular cancer. He does have a hx of vasectomy.   ROS: GU: As noted in HPI  Past Medical History:  Diagnosis Date  . Elevated cholesterol     Objective: BP 100/72 (BP Location: Left Arm, Patient Position: Sitting, Cuff Size: Normal)   Pulse 72   Temp 98.1 F (36.7 C) (Oral)   Ht 5' 10.5" (1.791 m)   Wt 209 lb (94.8 kg)   SpO2 95%   BMI 29.56 kg/m  General: Awake, appears stated age Lungs: No accessory muscle use GU: Circumcised, R testicle normal, L testicle of normal contour and without ttp, epididymis without ttp, small approx 0.4 circular lesion, mild ttp over this area, no skin changes over scrotum Psych: Age appropriate judgment and insight, normal affect and mood  Assessment and Plan: Lump in the testicle - Plan: US Scrotum  CK [place]. F/u pending its results.  The patient voiced understanding and agreement to the plan.  [place], DO 02/07/18  8:37 AM

## 2018-02-07 NOTE — Patient Instructions (Signed)
Give a few business days to hear about your ultrasound appointment.   We will be in touch regarding the results.   Let us know if you need anything.

## 2018-02-10 ENCOUNTER — Ambulatory Visit (HOSPITAL_BASED_OUTPATIENT_CLINIC_OR_DEPARTMENT_OTHER)
Admission: RE | Admit: 2018-02-10 | Discharge: 2018-02-10 | Disposition: A | Discharge status: Home / Self Care | Payer: BLUE CROSS/BLUE SHIELD | Source: Ambulatory Visit | Attending: Family Medicine | Admitting: Family Medicine

## 2018-02-10 ENCOUNTER — Other Ambulatory Visit: Payer: Self-pay | Admitting: Family Medicine

## 2018-02-10 ENCOUNTER — Encounter: Payer: Self-pay | Admitting: Family Medicine

## 2018-02-10 ENCOUNTER — Encounter (HOSPITAL_BASED_OUTPATIENT_CLINIC_OR_DEPARTMENT_OTHER): Payer: Self-pay

## 2018-02-10 DIAGNOSIS — N503 Cyst of epididymis: Secondary | ICD-10-CM | POA: Diagnosis not present

## 2018-02-10 DIAGNOSIS — N433 Hydrocele, unspecified: Secondary | ICD-10-CM | POA: Diagnosis not present

## 2018-02-10 DIAGNOSIS — N509 Disorder of male genital organs, unspecified: Secondary | ICD-10-CM | POA: Diagnosis not present

## 2018-02-10 DIAGNOSIS — N5089 Other specified disorders of the male genital organs: Secondary | ICD-10-CM

## 2018-07-02 ENCOUNTER — Encounter: Payer: Self-pay | Admitting: Family Medicine

## 2018-07-02 ENCOUNTER — Ambulatory Visit (INDEPENDENT_AMBULATORY_CARE_PROVIDER_SITE_OTHER): Payer: BLUE CROSS/BLUE SHIELD | Admitting: Family Medicine

## 2018-07-02 VITALS — BP 110/68 | HR 76 | Temp 98.1°F | Ht 71.0 in | Wt 204.2 lb

## 2018-07-02 DIAGNOSIS — E785 Hyperlipidemia, unspecified: Secondary | ICD-10-CM | POA: Insufficient documentation

## 2018-07-02 DIAGNOSIS — S56911A Strain of unspecified muscles, fascia and tendons at forearm level, right arm, initial encounter: Secondary | ICD-10-CM | POA: Diagnosis not present

## 2018-07-02 DIAGNOSIS — E559 Vitamin D deficiency, unspecified: Secondary | ICD-10-CM

## 2018-07-02 LAB — LIPID PANEL
CHOLESTEROL: 163 mg/dL (ref 0–200)
HDL: 34.7 mg/dL — ABNORMAL LOW (ref >39.00)
LDL Cholesterol: 90 mg/dL (ref 0–99)
NonHDL: 128.02
Total CHOL/HDL Ratio: 5
Triglycerides: 188 mg/dL — ABNORMAL HIGH (ref 0.0–149.0)
VLDL: 37.6 mg/dL (ref 0.0–40.0)

## 2018-07-02 LAB — VITAMIN D 25 HYDROXY (VIT D DEFICIENCY, FRACTURES): VITD: 26.05 ng/mL — AB (ref 30.00–100.00)

## 2018-07-02 NOTE — Progress Notes (Signed)
Musculoskeletal Exam  Patient: Jeremy Pierce DOB: 24-May-1971  DOS: 07/02/2018  SUBJECTIVE:  Chief Complaint:   Chief Complaint  Patient presents with  . Follow-up    Jeremy Pierce is a 47 y.o.  male for evaluation and treatment of R forearm pain.   Onset:  8 months ago.  Was doing pullups.  Location: Medial elbow on R Character:  burning  Progression of issue:  is unchanged Associated symptoms: None Treatment: to date has been OTC NSAIDS, ointment.   Neurovascular symptoms: no  Hyperlipidemia Patient presents for dyslipidemia follow up. Currently being treated with Zocor 20 mg/d and compliance with treatment thus far has been good. He denies myalgias. He is adhering to a healthy. Exercise: Will sometimes go to gym, has been walking The patient is not known to have coexisting coronary artery disease.  Hx of Vit D def, has not taken over past couple weeks. Feels mood is better when he is taking it. No AE's.   ROS: Musculoskeletal/Extremities: +R elbow pain Neuro: No numbness/tingling  Past Medical History:  Diagnosis Date  . Elevated cholesterol     Objective: VITAL SIGNS: BP 110/68 (BP Location: Left Arm, Patient Position: Sitting, Cuff Size: Normal)   Pulse 76   Temp 98.1 F (36.7 C) (Oral)   Ht 5\' 11"  (1.803 m)   Wt 204 lb 4 oz (92.6 kg)   SpO2 98%   BMI 28.49 kg/m  Constitutional: Well formed, well developed. No acute distress. Cardiovascular: RRR Thorax & Lungs: No accessory muscle use, CTAB Musculoskeletal: R elbow.   Normal active range of motion: yes.   Normal passive range of motion: yes Tenderness to palpation: Yes, over medial and proximal forearm flexors Deformity: no Ecchymosis: no Neurologic: Normal sensory function. No focal deficits noted. DTR's equal and symmetry in UE's. No clonus. Psychiatric: Normal mood. Age appropriate judgment and insight. Alert & oriented x 3.    Assessment:  Hyperlipidemia, unspecified hyperlipidemia type -  Plan: Lipid panel  Vitamin D deficiency - Plan: Vitamin D (25 hydroxy)  Strain of right forearm, initial encounter  Plan: Ck above, cont Zocor. Ck Vit D. Strain of flexor, wrist brace. Heat, ice, stretches/exercises. F/u 6 mo for CPE, sooner if elbow area not improved, OT if no improvement. The patient voiced understanding and agreement to the plan.   [place], DO 07/02/18  10:34 AM

## 2018-07-02 NOTE — Patient Instructions (Addendum)
Give [place] 2-3 business days to get the results of your labs back.   Keep the diet clean and stay active.  Ice/cold pack over area for 10-15 min twice daily.  Heat (pad or rice pillow in microwave) over affected area, 10-15 minutes twice daily.   Golfer's Elbow Rehab It is normal to feel mild stretching, pulling, tightness, or discomfort as you do these exercises, but you should stop right away if you feel sudden pain or your pain gets worse.   Stretching and range of motion exercises These exercises warm up your muscles and joints and improve the movement and flexibility of your elbow. Exercise A: Wrist flexors  1. Straighten your left / right elbow in front of you with your palm up. If told by your health care provider, do this stretch with your elbow bent rather than straight. 2. With your other hand, gently pull your left / right hand and fingers toward you until you feel a gentle stretch on the top of your forearm. 3. Hold this position for 30 seconds. Repeat 2 times. Complete this exercise 3 times per week. Strengthening exercises These exercises build strength and endurance in your elbow. Endurance is the ability to use your muscles for a long time, even after several repetitions.  Exercise B: Wrist flexion  1. Sit with your left / right forearm palm-up and supported on a table or other surface. 2. Let your left / right wrist extend over the edge of the surface. 3. Hold a 3-5 lb weight or a piece of rubber exercise band or tubing. Slowly curl your hand up toward your forearm. Try to only move your hand and keep the rest of your arm still. 4. Hold this position for 3-5 seconds. 5. Slowly return to the starting position. Repeat 2 times. Complete this exercise 3 times per week. Exercise C: Eccentric wrist flexion 1. Sit with your left / right forearm palm-up and supported on a table or other surface. 2. Let your left / right wrist extend over the edge of the surface. 3. Hold a 3-5  lb weight or a piece of rubber exercise band or tubing. 4. Use your other hand to move your left / right hand up toward your forearm. 5. Slowly return to the starting position using only your left / right hand. Repeat 2 times. Complete this exercise 3 times per week. Exercise D: Hand turns, pronation i 1. Sit with your left / right forearm supported on a table or other surface. 2. Move your wrist so your pinkie travels toward your forearm and your thumb moves away from your forearm. 3. Hold this position for 3 seconds. 4. Slowly return to the starting position. Exercise E: Hand turns, pronation ii  1. Sit with your left / right forearm supported on a table or other surface. 2. Hold a hammer in your left / right hand. ? The exercise will be easier if you hold on near the head of the hammer. ? If you hold on toward the end of the handle, the exercise will be harder. 3. Rest your left / right hand over the edge of the table with your palm facing up. 4. Without moving your elbow, slowly turn your palm and your hand down toward the table. 5. Hold this position for 3 seconds. 6. Slowly return to the starting position. Repeat 2 times. Complete this exercise 3 times per week. Exercise F: Shoulder blade squeeze 1. Sit in a stable chair with good posture. Do not  let your back touch the back of the chair. 2. Your arms should be at your sides with your elbows bent. You can rest your forearms on a pillow. 3. Squeeze your shoulder blades together. Keep your shoulders level. Do not lift your shoulders up toward your ears. 4. Hold this position for 3 seconds. 5. Slowly release. Repeat 2 times. Complete this exercise 3 times per week. Make sure you discuss any questions you have with your health care provider. Document Released: 07/16/2005 Document Revised: 03/22/2016 Document Reviewed: 03/28/2015 Elsevier Interactive Patient Education  [employer].

## 2018-07-02 NOTE — Progress Notes (Signed)
Pre visit review using our clinic review tool, if applicable. No additional management support is needed unless otherwise documented below in the visit note. 

## 2018-07-28 ENCOUNTER — Encounter: Payer: Self-pay | Admitting: Family Medicine

## 2018-08-25 DIAGNOSIS — H35363 Drusen (degenerative) of macula, bilateral: Secondary | ICD-10-CM | POA: Diagnosis not present

## 2018-12-16 DIAGNOSIS — S62111A Displaced fracture of triquetrum [cuneiform] bone, right wrist, initial encounter for closed fracture: Secondary | ICD-10-CM | POA: Diagnosis not present

## 2018-12-18 DIAGNOSIS — S62111A Displaced fracture of triquetrum [cuneiform] bone, right wrist, initial encounter for closed fracture: Secondary | ICD-10-CM | POA: Diagnosis not present

## 2019-01-01 ENCOUNTER — Encounter: Payer: BLUE CROSS/BLUE SHIELD | Admitting: Family Medicine

## 2019-01-01 DIAGNOSIS — S62111A Displaced fracture of triquetrum [cuneiform] bone, right wrist, initial encounter for closed fracture: Secondary | ICD-10-CM | POA: Diagnosis not present

## 2019-01-01 DIAGNOSIS — S62111D Displaced fracture of triquetrum [cuneiform] bone, right wrist, subsequent encounter for fracture with routine healing: Secondary | ICD-10-CM | POA: Diagnosis not present

## 2019-01-02 ENCOUNTER — Ambulatory Visit (INDEPENDENT_AMBULATORY_CARE_PROVIDER_SITE_OTHER): Payer: BC Managed Care – PPO | Admitting: Family Medicine

## 2019-01-02 ENCOUNTER — Other Ambulatory Visit: Payer: Self-pay

## 2019-01-02 ENCOUNTER — Encounter: Payer: Self-pay | Admitting: Family Medicine

## 2019-01-02 VITALS — BP 108/64 | HR 72 | Temp 97.7°F | Ht 70.0 in | Wt 207.0 lb

## 2019-01-02 DIAGNOSIS — Z125 Encounter for screening for malignant neoplasm of prostate: Secondary | ICD-10-CM | POA: Diagnosis not present

## 2019-01-02 DIAGNOSIS — Z23 Encounter for immunization: Secondary | ICD-10-CM | POA: Diagnosis not present

## 2019-01-02 DIAGNOSIS — Z0000 Encounter for general adult medical examination without abnormal findings: Secondary | ICD-10-CM | POA: Diagnosis not present

## 2019-01-02 MED ORDER — SIMVASTATIN 20 MG PO TABS: 20.0000 mg | ORAL_TABLET | Freq: Every day | ORAL | 3 refills | Status: DC

## 2019-01-02 NOTE — Addendum Note (Signed)
Addended by: Sharon Seller B on: 01/02/2019 01:56 PM   Modules accepted: Orders

## 2019-01-02 NOTE — Patient Instructions (Signed)
Keep the diet clean and stay active.  Give us 2-3 business days to get the results of your labs back.   Let us know if you need anything.  

## 2019-01-02 NOTE — Progress Notes (Signed)
Chief Complaint  Patient presents with  . Annual Exam    Well Male Jeremy Pierce is here for a complete physical.   His last physical was >1 year ago.  Current diet: in general, a "pretty clean" diet.   Current exercise: walking, staying active Weight trend: stable Daytime fatigue? No. Seat belt? Yes.    Health maintenance Tetanus- No HIV- Yes  Past Medical History:  Diagnosis Date  . Elevated cholesterol      Past Surgical History:  Procedure Laterality Date  . NO PAST SURGERIES    . POLYPECTOMY     Urinary tract per patient  . VASECTOMY      Medications  Current Outpatient Medications on File Prior to Visit  Medication Sig Dispense Refill  . Multiple Vitamins-Minerals (MULTIVITAMIN WITH MINERALS) tablet Take 1 tablet by mouth daily.    . Omega-3 Fatty Acids (FISH OIL PO) Take 2 capsules by mouth daily.    . simvastatin (ZOCOR) 20 MG tablet Take 1 tablet (20 mg total) by mouth at bedtime. 90 tablet 3  . Vitamin D, Ergocalciferol, (DRISDOL) 50000 units CAPS capsule Take 1 capsule (50,000 Units total) by mouth every 7 (seven) days. Mondays (Patient not taking: Reported on 01/02/2019) 12 capsule 0   Allergies No Known Allergies  Family History Family History  Problem Relation Age of Onset  . Cancer Mother        breast cancer and tongue cancer  . Kidney disease Father   . Heart disease Father   . Cancer Sister        breast    Review of Systems: Constitutional: no fevers or chills Eye:  no recent significant change in vision Ear/Nose/Mouth/Throat:  Ears:  no tinnitus or hearing loss Nose/Mouth/Throat:  no complaints of nasal congestion, no sore throat Cardiovascular:  no chest pain, no palpitations Respiratory:  no cough and no shortness of breath Gastrointestinal:  no abdominal pain, no change in bowel habits GU:  Male: negative for dysuria, frequency, and incontinence and negative for prostate symptoms Musculoskeletal/Extremities:  no pain, redness, or  swelling of the joints Integumentary (Skin/Breast):  no abnormal skin lesions reported Neurologic:  no headaches, no numbness, tingling Endocrine: No unexpected weight changes Hematologic/Lymphatic:  no night sweats  Exam BP 108/64 (BP Location: Left Arm, Patient Position: Sitting, Cuff Size: Large)   Pulse 72   Temp 97.7 F (36.5 C) (Oral)   Ht 5\' 10"  (1.778 m)   Wt 207 lb (93.9 kg)   SpO2 98%   BMI 29.70 kg/m  General:  well developed, well nourished, in no apparent distress Skin:  no significant moles, warts, or growths Head:  no masses, lesions, or tenderness Eyes:  pupils equal and round, sclera anicteric without injection Ears:  canals without lesions, TMs shiny without retraction, no obvious effusion, no erythema Nose:  nares patent, septum midline, mucosa normal Throat/Pharynx:  lips and gingiva without lesion; tongue and uvula midline; non-inflamed pharynx; no exudates or postnasal drainage Neck: neck supple without adenopathy, thyromegaly, or masses Lungs:  clear to auscultation, breath sounds equal bilaterally, no respiratory distress Cardio:  regular rate and rhythm, no bruits, no LE edema Abdomen:  abdomen soft, nontender; bowel sounds normal; no masses or organomegaly Rectal: Deferred Musculoskeletal:  symmetrical muscle groups noted without atrophy or deformity Extremities:  no clubbing, cyanosis, or edema, no deformities, no skin discoloration Neuro:  gait normal; deep tendon reflexes normal and symmetric Psych: well oriented with normal range of affect and appropriate judgment/insight  Assessment  and Plan  Well adult exam - Plan: Comprehensive metabolic panel, Lipid panel, Vitamin D (25 hydroxy)  Screening PSA (prostate specific antigen) - Plan: PSA   Well 48 y.o. male. Counseled on diet and exercise. Other orders as above. Tdap today. Follow up in 6 mo pending the above workup. The patient voiced understanding and agreement to the plan.  [place], DO 01/02/19 1:54 PM

## 2019-01-02 NOTE — Addendum Note (Signed)
Addended by: Kelle Darting A on: 01/02/2019 02:01 PM   Modules accepted: Orders

## 2019-01-03 LAB — LIPID PANEL
Cholesterol: 159 mg/dL (ref <200)
HDL: 37 mg/dL — ABNORMAL LOW (ref >=40)
LDL Cholesterol (Calc): 93 mg/dL (calc)
Non-HDL Cholesterol (Calc): 122 mg/dL (calc) (ref <130)
Total CHOL/HDL Ratio: 4.3 (calc) (ref <5.0)
Triglycerides: 202 mg/dL — ABNORMAL HIGH (ref <150)

## 2019-01-03 LAB — PSA: PSA: 0.4 ng/mL (ref <=4.0)

## 2019-01-03 LAB — COMPREHENSIVE METABOLIC PANEL
AG Ratio: 1.9 (calc) (ref 1.0–2.5)
ALT: 30 U/L (ref 9–46)
AST: 20 U/L (ref 10–40)
Albumin: 4.7 g/dL (ref 3.6–5.1)
Alkaline phosphatase (APISO): 62 U/L (ref 36–130)
BUN: 13 mg/dL (ref 7–25)
CO2: 21 mmol/L (ref 20–32)
Calcium: 9.8 mg/dL (ref 8.6–10.3)
Chloride: 106 mmol/L (ref 98–110)
Creat: 1.17 mg/dL (ref 0.60–1.35)
Globulin: 2.5 g/dL (calc) (ref 1.9–3.7)
Glucose, Bld: 96 mg/dL (ref 65–99)
Potassium: 4.2 mmol/L (ref 3.5–5.3)
Sodium: 139 mmol/L (ref 135–146)
Total Bilirubin: 0.5 mg/dL (ref 0.2–1.2)
Total Protein: 7.2 g/dL (ref 6.1–8.1)

## 2019-01-03 LAB — VITAMIN D 25 HYDROXY (VIT D DEFICIENCY, FRACTURES): Vit D, 25-Hydroxy: 26 ng/mL — ABNORMAL LOW (ref 30–100)

## 2019-01-29 DIAGNOSIS — S62111D Displaced fracture of triquetrum [cuneiform] bone, right wrist, subsequent encounter for fracture with routine healing: Secondary | ICD-10-CM | POA: Diagnosis not present

## 2019-02-13 ENCOUNTER — Other Ambulatory Visit: Payer: Self-pay | Admitting: Family Medicine

## 2019-04-20 ENCOUNTER — Encounter: Payer: Self-pay | Admitting: Family Medicine

## 2019-04-22 ENCOUNTER — Other Ambulatory Visit: Payer: Self-pay

## 2019-04-22 ENCOUNTER — Encounter: Payer: Self-pay | Admitting: Family Medicine

## 2019-04-22 ENCOUNTER — Ambulatory Visit (INDEPENDENT_AMBULATORY_CARE_PROVIDER_SITE_OTHER): Payer: BC Managed Care – PPO | Admitting: Family Medicine

## 2019-04-22 VITALS — BP 106/76 | HR 75 | Temp 97.5°F | Ht 70.5 in | Wt 207.2 lb

## 2019-04-22 DIAGNOSIS — R000 Tachycardia, unspecified: Secondary | ICD-10-CM | POA: Diagnosis not present

## 2019-04-22 DIAGNOSIS — N529 Male erectile dysfunction, unspecified: Secondary | ICD-10-CM | POA: Diagnosis not present

## 2019-04-22 LAB — COMPREHENSIVE METABOLIC PANEL
ALT: 29 U/L (ref 0–53)
AST: 24 U/L (ref 0–37)
Albumin: 4.5 g/dL (ref 3.5–5.2)
Alkaline Phosphatase: 56 U/L (ref 39–117)
BUN: 13 mg/dL (ref 6–23)
CO2: 27 mEq/L (ref 19–32)
Calcium: 9.8 mg/dL (ref 8.4–10.5)
Chloride: 102 mEq/L (ref 96–112)
Creatinine, Ser: 1.03 mg/dL (ref 0.40–1.50)
GFR: 77.17 mL/min (ref >60.00)
Glucose, Bld: 128 mg/dL — ABNORMAL HIGH (ref 70–99)
Potassium: 4.1 mEq/L (ref 3.5–5.1)
Sodium: 137 mEq/L (ref 135–145)
Total Bilirubin: 0.6 mg/dL (ref 0.2–1.2)
Total Protein: 7 g/dL (ref 6.0–8.3)

## 2019-04-22 LAB — CBC
HCT: 44.8 % (ref 39.0–52.0)
Hemoglobin: 14.9 g/dL (ref 13.0–17.0)
MCHC: 33.2 g/dL (ref 30.0–36.0)
MCV: 86.6 fl (ref 78.0–100.0)
Platelets: 231 10*3/uL (ref 150.0–400.0)
RBC: 5.17 Mil/uL (ref 4.22–5.81)
RDW: 14 % (ref 11.5–15.5)
WBC: 6.8 10*3/uL (ref 4.0–10.5)

## 2019-04-22 LAB — TSH: TSH: 1.23 u[IU]/mL (ref 0.35–4.50)

## 2019-04-22 NOTE — Progress Notes (Signed)
Chief Complaint  Patient presents with  . Tachycardia    sunday night    Subjective: Patient is a 48 y.o. male here for increased HR.  3 days ago, the patient started experiencing around 5 minutes of his heart rate around 130.  This is never happened before.  He was drying off after getting out of the shower.  He reports being tired prior to this happening.  He did not feel lightheaded, dizzy, have any vision changes, chest pain, or shortness of breath.  He felt ill for the next 10 minutes but recovered well after that.  He has not had any issues since then.  No prior history of arrhythmia.  He had Pepsi at dinner, no alcohol.  No other illicit drugs.  No recent medication changes.  Patient continues have issues with maintaining an erection.  This is been going on for months.  He has never tried any medication for this and is unsure if he wishes to.  Libido is still sound for his age.   ROS: Heart: Denies chest pain  Lungs: Denies SOB   Past Medical History:  Diagnosis Date  . Elevated cholesterol     Objective: BP 106/76 (BP Location: Left Arm, Patient Position: Sitting, Cuff Size: Normal)   Pulse 75   Temp (!) 97.5 F (36.4 C) (Temporal)   Ht 5' 10.5" (1.791 m)   Wt 207 lb 4 oz (94 kg)   SpO2 98%   BMI 29.32 kg/m  General: Awake, appears stated age HEENT: MMM, EOMi Heart: RRR, no lower extremity edema Lungs: CTAB, no rales, wheezes or rhonchi. No accessory muscle use Psych: Age appropriate judgment and insight, normal affect and mood  Assessment and Plan: Tachycardia - Plan: EKG 12-Lead, CBC, Comprehensive metabolic panel, TSH  Erectile dysfunction, unspecified erectile dysfunction type  1-check EKG, labs. EKG shows NSR. No signs of ischemia. If this happens again, will consider Holter monitor. 2-offered medication, if he is interested, recommended calling insurance company to see what they will cover. Follow-up as originally scheduled. The patient voiced  understanding and agreement to the plan.  [place], DO 04/22/19  10:56 AM

## 2019-04-22 NOTE — Patient Instructions (Addendum)
Give Jeremy Pierce 2-3 business days to get the results of your labs back.   Stay hydrated.   EKG looks good. If the labs are normal and this happens again, we will set you up with a Holter monitor again.  If interested, call your insurance company to see what erectile dysfunction medications they wil cover. Looking for manufacturer coupons online is also reasonable.   Let Jeremy Pierce know if you need anything.

## 2019-04-24 ENCOUNTER — Ambulatory Visit: Payer: BC Managed Care – PPO | Admitting: Family Medicine

## 2019-05-05 ENCOUNTER — Encounter: Payer: Self-pay | Admitting: Family Medicine

## 2019-05-06 ENCOUNTER — Other Ambulatory Visit: Payer: Self-pay | Admitting: Family Medicine

## 2019-05-06 MED ORDER — SILDENAFIL CITRATE 100 MG PO TABS: 50.0000 mg | ORAL_TABLET | Freq: Every day | ORAL | 5 refills | Status: DC | PRN

## 2019-07-06 ENCOUNTER — Other Ambulatory Visit: Payer: Self-pay

## 2019-07-07 ENCOUNTER — Other Ambulatory Visit: Payer: Self-pay | Admitting: Family Medicine

## 2019-07-07 ENCOUNTER — Ambulatory Visit (INDEPENDENT_AMBULATORY_CARE_PROVIDER_SITE_OTHER): Payer: BC Managed Care – PPO | Admitting: Family Medicine

## 2019-07-07 ENCOUNTER — Encounter: Payer: Self-pay | Admitting: Family Medicine

## 2019-07-07 ENCOUNTER — Encounter: Payer: BC Managed Care – PPO | Admitting: Family Medicine

## 2019-07-07 VITALS — BP 104/64 | HR 75 | Temp 96.0°F | Ht 71.0 in | Wt 207.0 lb

## 2019-07-07 DIAGNOSIS — Z23 Encounter for immunization: Secondary | ICD-10-CM | POA: Diagnosis not present

## 2019-07-07 DIAGNOSIS — E1165 Type 2 diabetes mellitus with hyperglycemia: Secondary | ICD-10-CM | POA: Diagnosis not present

## 2019-07-07 DIAGNOSIS — E785 Hyperlipidemia, unspecified: Secondary | ICD-10-CM

## 2019-07-07 DIAGNOSIS — R7303 Prediabetes: Secondary | ICD-10-CM | POA: Diagnosis not present

## 2019-07-07 LAB — HEMOGLOBIN A1C: Hgb A1c MFr Bld: 6.8 % — ABNORMAL HIGH (ref 4.6–6.5)

## 2019-07-07 LAB — LIPID PANEL
Cholesterol: 165 mg/dL (ref 0–200)
HDL: 30.9 mg/dL — ABNORMAL LOW (ref >39.00)
NonHDL: 134.42
Total CHOL/HDL Ratio: 5
Triglycerides: 356 mg/dL — ABNORMAL HIGH (ref 0.0–149.0)
VLDL: 71.2 mg/dL — ABNORMAL HIGH (ref 0.0–40.0)

## 2019-07-07 LAB — LDL CHOLESTEROL, DIRECT: Direct LDL: 79 mg/dL

## 2019-07-07 NOTE — Progress Notes (Addendum)
Chief Complaint  Patient presents with  . Follow-up    Subjective: Hyperlipidemia Patient presents for Hyperlipidemia follow up. Currently taking Zocor 20 mg/d and compliance with treatment thus far has been good. He denies myalgias. He is usually adhering to a healthy diet. Exercise: walking The patient is not known to have coexisting coronary artery disease.  Prediabetes Pt had elevated sugar. Was told to clean up diet. It is looking like that did not happen during the entire time since his last visit. He is walking. He does have a famhx of DM.   Addendum: I received the patient's labs and he agreed to schedule a telephone encounter.  I verified his identity with 2 identifiers and he consented to proceed.  His A1c is 6.8.  He does have a family history of diabetes.  He is on Zocor.  He follows with an eye doctor yearly and his appointment is coming up next month.  He has never had a pneumonia vaccine.  He got the flu shot today.  ROS: Heart: Denies chest pain Lungs: Denies SOB  Past Medical History:  Diagnosis Date  . Elevated cholesterol     Objective: BP 104/64 (BP Location: Left Arm, Patient Position: Sitting, Cuff Size: Normal)   Pulse 75   Temp (!) 96 F (35.6 C) (Temporal)   Ht 5\' 11"  (1.803 m)   Wt 207 lb (93.9 kg)   SpO2 97%   BMI 28.87 kg/m  General: Awake, appears stated age HEENT: MMM Heart: RRR, no LE edema, no bruits Lungs: CTAB, no rales, wheezes or rhonchi. No accessory muscle use Psych: Age appropriate judgment and insight, normal affect and mood  Assessment and Plan: Hyperlipidemia, unspecified hyperlipidemia type - Plan: Lipid Profile  Need for influenza vaccination - Plan: Flu Vaccine QUAD 6+ mos PF IM (Fluarix Quad PF)  Prediabetes - Plan: HgB A1c  Type 2 diabetes mellitus with hyperglycemia, without long-term current use of insulin (HCC)  1- Cont Zocor. Counseled on diet and exercise. 2- Hopefully his A1c has not increased. F/u in 6 mo  pending A1c.  The patient voiced understanding and agreement to the plan.  Addendum: Counseled on prognosis and management of diabetes.  I do not believe we need to add any oral hypoglycemics and certainly not insulin at this time.  Counseled on diet and exercise.  I will have our office reach out to him to check a microalbumin creatinine ratio in addition to giving him his pneumonia shot.  We will re-vaccinate in approximately 7-10 years.  He has an eye appointment next month.  He will let them know he has diabetes and have them send their office note here.  I will do a foot exam at his next visit in 6 months.  He will contact his insurance company and find out which close meter they will cover and let us know.  [place], DO 07/07/19  4:21 PM

## 2019-07-07 NOTE — Addendum Note (Signed)
Addended by: Ames Coupe on: 07/07/2019 04:27 PM   Modules accepted: Orders, Level of Service

## 2019-07-07 NOTE — Patient Instructions (Signed)
Give us 2-3 business days to get the results of your labs back.   Keep the diet clean and stay active.  Let us know if you need anything. 

## 2019-07-08 NOTE — Progress Notes (Signed)
Erroneous encounter, please disregard

## 2019-07-27 ENCOUNTER — Other Ambulatory Visit: Payer: Self-pay

## 2019-07-27 ENCOUNTER — Encounter (HOSPITAL_COMMUNITY): Payer: Self-pay

## 2019-07-27 ENCOUNTER — Ambulatory Visit (HOSPITAL_COMMUNITY)
Admission: EM | Admit: 2019-07-27 | Discharge: 2019-07-27 | Disposition: A | Discharge status: Home / Self Care | Payer: BC Managed Care – PPO | Attending: Family Medicine | Admitting: Family Medicine

## 2019-07-27 DIAGNOSIS — N451 Epididymitis: Secondary | ICD-10-CM | POA: Diagnosis not present

## 2019-07-27 MED ORDER — DOXYCYCLINE HYCLATE 100 MG PO CAPS: 100.0000 mg | ORAL_CAPSULE | Freq: Two times a day (BID) | ORAL | 0 refills | Status: DC

## 2019-07-27 NOTE — Discharge Instructions (Addendum)
Take antibiotic as directed Follow up PCP if you fail to improve

## 2019-07-27 NOTE — ED Triage Notes (Signed)
Pt states he was moving some logs on Saturday. Pt states he has left testicle pain. Pt thinks he might has a Herina. ( Pt states she has some swelling)

## 2019-07-27 NOTE — ED Provider Notes (Signed)
[place]    CSN: [id number] Arrival date & time: 07/27/19  [id number]      History   Chief Complaint Chief Complaint  Patient presents with  . Possible Hernia    HPI Jeremy Pierce is a 48 y.o. male.   HPI  Did heavy lifting Saturday Did not feel strain Next day had swelling in L testicle Some pain in groin No change in appetite, bowels, urination  Past Medical History:  Diagnosis Date  . Elevated cholesterol     Patient Active Problem List   Diagnosis Date Noted  . Prediabetes 07/07/2019  . Type 2 diabetes mellitus with hyperglycemia, without long-term current use of insulin ([place]) 07/07/2019  . Hyperlipidemia 07/02/2018    Past Surgical History:  Procedure Laterality Date  . NO PAST SURGERIES    . POLYPECTOMY     Urinary tract per patient  . VASECTOMY         Home Medications    Prior to Admission medications   Medication Sig Start Date End Date Taking? Authorizing Provider  doxycycline (VIBRAMYCIN) 100 MG capsule Take 1 capsule (100 mg total) by mouth 2 (two) times daily. 07/27/19   Raylene Everts, MD  Multiple Vitamins-Minerals (MULTIVITAMIN WITH MINERALS) tablet Take 1 tablet by mouth daily.    [provider]  sildenafil (VIAGRA) 100 MG tablet Take 0.5-1 tablets (50-100 mg total) by mouth daily as needed for erectile dysfunction. 05/06/19   Shelda Pal, DO  simvastatin (ZOCOR) 20 MG tablet TAKE 1 TABLET(20 MG) BY MOUTH AT BEDTIME 02/13/19   Wendling, Crosby Oyster, DO    Family History Family History  Problem Relation Age of Onset  . Cancer Mother        breast cancer and tongue cancer  . Diabetes Mother   . Kidney disease Father   . Heart disease Father   . Cancer Sister        breast  . Diabetes Maternal Grandfather     Social History Social History   Tobacco Use  . Smoking status: Former Research scientist (life sciences)  . Smokeless tobacco: Never Used  Substance Use Topics  . Alcohol use: Yes    Comment: drinks beer--very  seldom though  . Drug use: Never     Allergies   Patient has no known allergies.   Review of Systems Review of Systems  Constitutional: Negative for chills and fever.  HENT: Negative for congestion and hearing loss.   Eyes: Negative for pain.  Respiratory: Negative for cough and shortness of breath.   Cardiovascular: Negative for chest pain and leg swelling.  Gastrointestinal: Negative for abdominal pain, constipation and diarrhea.  Genitourinary: Positive for scrotal swelling and testicular pain. Negative for decreased urine volume, dysuria and frequency.  Musculoskeletal: Negative for myalgias.  Neurological: Negative for dizziness, seizures and headaches.  Psychiatric/Behavioral: The patient is not nervous/anxious.      Physical Exam Triage Vital Signs ED Triage Vitals  Enc Vitals Group     BP 07/27/19 1113 120/74     Pulse Rate 07/27/19 1113 74     Resp 07/27/19 1113 18     Temp 07/27/19 1113 98.1 F (36.7 C)     Temp src --      SpO2 07/27/19 1113 100 %     Weight 07/27/19 1111 200 lb (90.7 kg)     Height --      Head Circumference --      Peak Flow --      Pain Score  07/27/19 1111 7     Pain Loc --      Pain Edu? --      Excl. in [place]? --    No data found.  Updated Vital Signs BP 120/74 (BP Location: Right Arm)   Pulse 74   Temp 98.1 F (36.7 C)   Resp 18   Wt 90.7 kg   SpO2 100%   BMI 27.89 kg/m   Visual Acuity Right Eye Distance:   Left Eye Distance:   Bilateral Distance:    Right Eye Near:   Left Eye Near:    Bilateral Near:     Physical Exam Constitutional:      General: He is not in acute distress.    Appearance: He is well-developed.  HENT:     Head: Normocephalic and atraumatic.  Eyes:     Conjunctiva/sclera: Conjunctivae normal.     Pupils: Pupils are equal, round, and reactive to light.  Cardiovascular:     Rate and Rhythm: Normal rate.  Pulmonary:     Effort: Pulmonary effort is normal. No respiratory distress.  Abdominal:      General: There is no distension.     Palpations: Abdomen is soft.  Genitourinary:    Penis: Normal.      Testes: Normal.     Comments: l epididymus swollen and tender.  No hernia. Musculoskeletal:        General: Normal range of motion.     Cervical back: Normal range of motion.  Skin:    General: Skin is warm and dry.  Neurological:     Mental Status: He is alert.      UC Treatments / Results  Labs (all labs ordered are listed, but only abnormal results are displayed) Labs Reviewed - No data to display  EKG   Radiology No results found.  Procedures Procedures (including critical care time)  Medications Ordered in UC Medications - No data to display  Initial Impression / Assessment and Plan / UC Course  I have reviewed the triage vital signs and the nursing notes.  Pertinent labs & imaging results that were available during my care of the patient were reviewed by me and considered in my medical decision making (see chart for details).      Final Clinical Impressions(s) / UC Diagnoses   Final diagnoses:  Epididymitis, left     Discharge Instructions     Take antibiotic as directed Follow up PCP if you fail to improve   ED Prescriptions    Medication Sig Dispense Auth. Provider   doxycycline (VIBRAMYCIN) 100 MG capsule Take 1 capsule (100 mg total) by mouth 2 (two) times daily. 20 capsule Raylene Everts, MD     PDMP not reviewed this encounter.   Raylene Everts, MD 07/27/19 2136

## 2019-08-05 ENCOUNTER — Ambulatory Visit (INDEPENDENT_AMBULATORY_CARE_PROVIDER_SITE_OTHER): Payer: BC Managed Care – PPO

## 2019-08-05 ENCOUNTER — Other Ambulatory Visit (INDEPENDENT_AMBULATORY_CARE_PROVIDER_SITE_OTHER): Payer: BC Managed Care – PPO

## 2019-08-05 ENCOUNTER — Encounter: Payer: Self-pay | Admitting: Family Medicine

## 2019-08-05 ENCOUNTER — Other Ambulatory Visit: Payer: Self-pay

## 2019-08-05 DIAGNOSIS — Z23 Encounter for immunization: Secondary | ICD-10-CM | POA: Diagnosis not present

## 2019-08-05 DIAGNOSIS — E785 Hyperlipidemia, unspecified: Secondary | ICD-10-CM

## 2019-08-05 DIAGNOSIS — E1165 Type 2 diabetes mellitus with hyperglycemia: Secondary | ICD-10-CM

## 2019-08-05 LAB — LIPID PANEL
Cholesterol: 123 mg/dL (ref 0–200)
HDL: 32 mg/dL — ABNORMAL LOW (ref >39.00)
LDL Cholesterol: 64 mg/dL (ref 0–99)
NonHDL: 90.92
Total CHOL/HDL Ratio: 4
Triglycerides: 136 mg/dL (ref 0.0–149.0)
VLDL: 27.2 mg/dL (ref 0.0–40.0)

## 2019-08-05 LAB — MICROALBUMIN / CREATININE URINE RATIO
Creatinine,U: 188.7 mg/dL
Microalb Creat Ratio: 0.4 mg/g (ref 0.0–30.0)
Microalb, Ur: <0.7 mg/dL (ref 0.0–1.9)

## 2019-08-05 NOTE — Progress Notes (Signed)
Pt here to receive Pneumococcal 23. Shot given in left deltoid.  Patient handled well.

## 2019-08-31 DIAGNOSIS — H35363 Drusen (degenerative) of macula, bilateral: Secondary | ICD-10-CM | POA: Diagnosis not present

## 2019-09-07 DIAGNOSIS — H109 Unspecified conjunctivitis: Secondary | ICD-10-CM | POA: Diagnosis not present

## 2019-10-29 ENCOUNTER — Ambulatory Visit: Payer: BC Managed Care – PPO | Attending: Internal Medicine

## 2019-10-29 DIAGNOSIS — Z23 Encounter for immunization: Secondary | ICD-10-CM

## 2019-10-29 NOTE — Progress Notes (Signed)
   Covid-19 Vaccination Clinic  Name:  Eugenio Tien    MRN: [id number] DOB: [birth year]  10/29/2019  Mr. Kamm was observed post Covid-19 immunization for 15 minutes without incident. He was provided with Vaccine Information Sheet and instruction to access the V-Safe system.   Mr. Haasch was instructed to call 911 with any severe reactions post vaccine: Marland Kitchen Difficulty breathing  . Swelling of face and throat  . A fast heartbeat  . A bad rash all over body  . Dizziness and weakness   Immunizations Administered    Name Date Dose VIS Date Route   Pfizer COVID-19 Vaccine 10/29/2019  9:32 AM 0.3 mL 07/10/2019 Intramuscular   Manufacturer: [place]   Lot: [id number]   [place]: [id number]

## 2019-11-23 ENCOUNTER — Ambulatory Visit: Payer: BC Managed Care – PPO | Attending: Internal Medicine

## 2019-11-23 DIAGNOSIS — Z23 Encounter for immunization: Secondary | ICD-10-CM

## 2019-11-23 NOTE — Progress Notes (Signed)
   Covid-19 Vaccination Clinic  Name:  Blandon Watry    MRN: [id number] DOB: 04-05-1971  11/23/2019  Mr. Baumgardner was observed post Covid-19 immunization for 15 minutes without incident. He was provided with Vaccine Information Sheet and instruction to access the V-Safe system.   Mr. Buisson was instructed to call 911 with any severe reactions post vaccine: Marland Kitchen Difficulty breathing  . Swelling of face and throat  . A fast heartbeat  . A bad rash all over body  . Dizziness and weakness   Immunizations Administered    Name Date Dose VIS Date Route   Pfizer COVID-19 Vaccine 11/23/2019  8:50 AM 0.3 mL 09/23/2018 Intramuscular   Manufacturer: [place]   Lot: [id number]   [place]: [id number]

## 2020-01-05 ENCOUNTER — Other Ambulatory Visit: Payer: Self-pay | Admitting: Family Medicine

## 2020-01-05 ENCOUNTER — Encounter: Payer: Self-pay | Admitting: Family Medicine

## 2020-01-05 ENCOUNTER — Ambulatory Visit (INDEPENDENT_AMBULATORY_CARE_PROVIDER_SITE_OTHER): Payer: BC Managed Care – PPO | Admitting: Family Medicine

## 2020-01-05 ENCOUNTER — Other Ambulatory Visit: Payer: Self-pay

## 2020-01-05 VITALS — BP 110/62 | HR 79 | Temp 95.4°F | Ht 70.5 in | Wt 205.0 lb

## 2020-01-05 DIAGNOSIS — Z125 Encounter for screening for malignant neoplasm of prostate: Secondary | ICD-10-CM

## 2020-01-05 DIAGNOSIS — E1165 Type 2 diabetes mellitus with hyperglycemia: Secondary | ICD-10-CM | POA: Diagnosis not present

## 2020-01-05 DIAGNOSIS — Z0000 Encounter for general adult medical examination without abnormal findings: Secondary | ICD-10-CM | POA: Diagnosis not present

## 2020-01-05 LAB — PSA: PSA: 0.51 ng/mL (ref 0.10–4.00)

## 2020-01-05 LAB — LIPID PANEL
Cholesterol: 151 mg/dL (ref 0–200)
HDL: 31.8 mg/dL — ABNORMAL LOW (ref >39.00)
LDL Cholesterol: 81 mg/dL (ref 0–99)
NonHDL: 118.84
Total CHOL/HDL Ratio: 5
Triglycerides: 191 mg/dL — ABNORMAL HIGH (ref 0.0–149.0)
VLDL: 38.2 mg/dL (ref 0.0–40.0)

## 2020-01-05 LAB — COMPREHENSIVE METABOLIC PANEL
ALT: 26 U/L (ref 0–53)
AST: 20 U/L (ref 0–37)
Albumin: 4.4 g/dL (ref 3.5–5.2)
Alkaline Phosphatase: 51 U/L (ref 39–117)
BUN: 15 mg/dL (ref 6–23)
CO2: 26 mEq/L (ref 19–32)
Calcium: 9.3 mg/dL (ref 8.4–10.5)
Chloride: 105 mEq/L (ref 96–112)
Creatinine, Ser: 1.33 mg/dL (ref 0.40–1.50)
GFR: 57.29 mL/min — ABNORMAL LOW (ref >60.00)
Glucose, Bld: 133 mg/dL — ABNORMAL HIGH (ref 70–99)
Potassium: 4.3 mEq/L (ref 3.5–5.1)
Sodium: 137 mEq/L (ref 135–145)
Total Bilirubin: 0.6 mg/dL (ref 0.2–1.2)
Total Protein: 6.6 g/dL (ref 6.0–8.3)

## 2020-01-05 LAB — CBC
HCT: 43.5 % (ref 39.0–52.0)
Hemoglobin: 14.6 g/dL (ref 13.0–17.0)
MCHC: 33.5 g/dL (ref 30.0–36.0)
MCV: 87 fl (ref 78.0–100.0)
Platelets: 224 10*3/uL (ref 150.0–400.0)
RBC: 5 Mil/uL (ref 4.22–5.81)
RDW: 14.1 % (ref 11.5–15.5)
WBC: 7.3 10*3/uL (ref 4.0–10.5)

## 2020-01-05 LAB — HEMOGLOBIN A1C: Hgb A1c MFr Bld: 7.3 % — ABNORMAL HIGH (ref 4.6–6.5)

## 2020-01-05 MED ORDER — METFORMIN HCL 500 MG PO TABS: ORAL_TABLET | ORAL | 1 refills | Status: DC

## 2020-01-05 MED ORDER — SIMVASTATIN 20 MG PO TABS: ORAL_TABLET | ORAL | 3 refills | Status: DC

## 2020-01-05 NOTE — Patient Instructions (Addendum)
Give us 2-3 business days to get the results of your labs back.   Keep the diet clean and stay active.  Let us know if you need anything. 

## 2020-01-05 NOTE — Progress Notes (Signed)
Chief Complaint  Patient presents with  . Follow-up    Well Male Jeremy Pierce is here for a complete physical.   His last physical was >1 year ago.  Current diet: in general, a "healthy" diet.   Current exercise: walking Weight trend: stable Daytime fatigue? Yes. Seat belt? Yes.    Health maintenance Tetanus- Yes HIV- Yes  Past Medical History:  Diagnosis Date  . Elevated cholesterol     Past Surgical History:  Procedure Laterality Date  . NO PAST SURGERIES    . POLYPECTOMY     Urinary tract per patient  . VASECTOMY      Medications  Current Outpatient Medications on File Prior to Visit  Medication Sig Dispense Refill  . Multiple Vitamins-Minerals (MULTIVITAMIN WITH MINERALS) tablet Take 1 tablet by mouth daily.    . simvastatin (ZOCOR) 20 MG tablet TAKE 1 TABLET(20 MG) BY MOUTH AT BEDTIME 90 tablet 3   Allergies No Known Allergies  Family History Family History  Problem Relation Age of Onset  . Cancer Mother        breast cancer and tongue cancer  . Diabetes Mother   . Kidney disease Father   . Heart disease Father   . Cancer Sister        breast  . Diabetes Maternal Grandfather     Review of Systems: Constitutional: no fevers or chills Eye:  no recent significant change in vision Ear/Nose/Mouth/Throat:  Ears:  no hearing loss Nose/Mouth/Throat:  no complaints of nasal congestion, no sore throat Cardiovascular:  no chest pain Respiratory:  no shortness of breath Gastrointestinal:  no abdominal pain, no change in bowel habits GU:  Male: negative for dysuria, frequency, and incontinence Musculoskeletal/Extremities:  no pain of the joints Integumentary (Skin/Breast):  no abnormal skin lesions reported Neurologic:  no headaches Endocrine: No unexpected weight changes Hematologic/Lymphatic:  no night sweats  Exam BP 110/62 (BP Location: Left Arm, Patient Position: Sitting, Cuff Size: Normal)   Pulse 79   Temp (!) 95.4 F (35.2 C) (Temporal)   Ht  5' 10.5" (1.791 m)   Wt 205 lb (93 kg)   SpO2 98%   BMI 29.00 kg/m  General:  well developed, well nourished, in no apparent distress Skin:  no significant moles, warts, or growths Head:  no masses, lesions, or tenderness Eyes:  pupils equal and round, sclera anicteric without injection Ears:  canals without lesions, TMs shiny without retraction, no obvious effusion, no erythema Nose:  nares patent, septum midline, mucosa normal Throat/Pharynx:  lips and gingiva without lesion; tongue and uvula midline; non-inflamed pharynx; no exudates or postnasal drainage Neck: neck supple without adenopathy, thyromegaly, or masses Lungs:  clear to auscultation, breath sounds equal bilaterally, no respiratory distress Cardio:  regular rate and rhythm, no bruits, no LE edema Abdomen:  abdomen soft, nontender; bowel sounds normal; no masses or organomegaly Rectal: Deferred Musculoskeletal:  symmetrical muscle groups noted without atrophy or deformity Extremities:  no clubbing, cyanosis, or edema, no deformities, no skin discoloration Neuro:  gait normal; deep tendon reflexes normal and symmetric Psych: well oriented with normal range of affect and appropriate judgment/insight  Assessment and Plan  Well adult exam - Plan: CBC, Comprehensive metabolic panel, Lipid panel  Screening PSA (prostate specific antigen) - Plan: PSA  Type 2 diabetes mellitus with hyperglycemia, without long-term current use of insulin (HCC) - Plan: Hemoglobin A1c, HM DIABETES FOOT EXAM   Well 49 y.o. male. Counseled on diet and exercise. Counseled on risks and benefits  of prostate cancer screening with PSA. The patient agrees to undergo screening.  Will see if insurance is covering CCS. Other orders as above. Follow up in 6 mo pending the above workup. The patient voiced understanding and agreement to the plan.  [place], DO 01/05/20 8:16 AM

## 2020-01-21 IMAGING — US US SCROTUM W/ DOPPLER COMPLETE
1 series · 13 of 25 positions shown · non-contrast
Comparison: None.

CLINICAL DATA: Initial evaluation for left testicular lump for 1
week.

EXAM:
SCROTAL ULTRASOUND
DOPPLER ULTRASOUND OF THE TESTICLES
TECHNIQUE: Complete ultrasound examination of the testicles, epididymis, and
other scrotal structures was performed. Color and spectral Doppler
ultrasound were also utilized to evaluate blood flow to the
testicles.

[Series 1: us scrotum w/ doppler complete · 0.07mm/px · 13 of 84 slices shown]
[im 1/84]
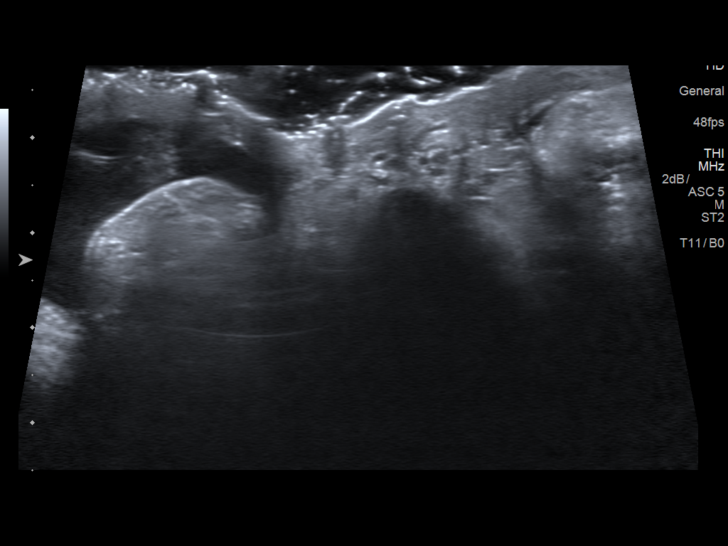
[im 7/84]
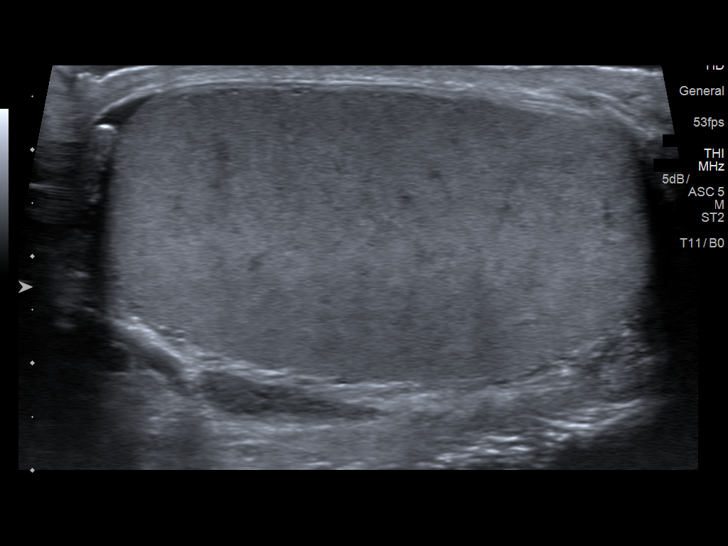
[im 14/84]
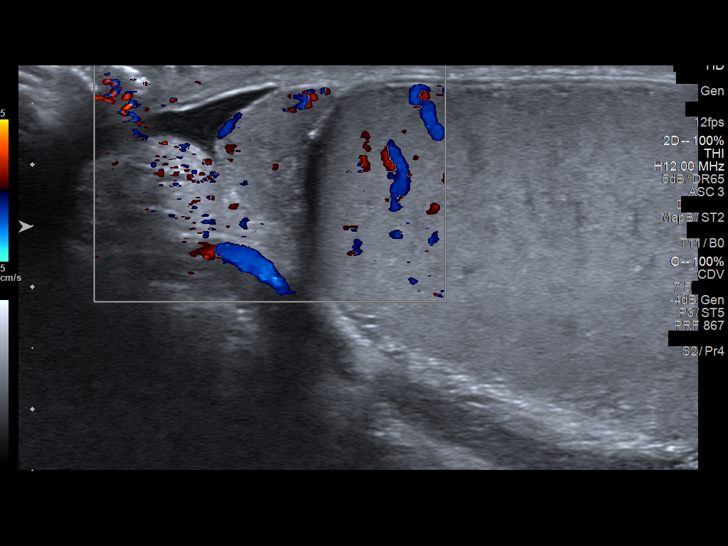
[im 21/84]
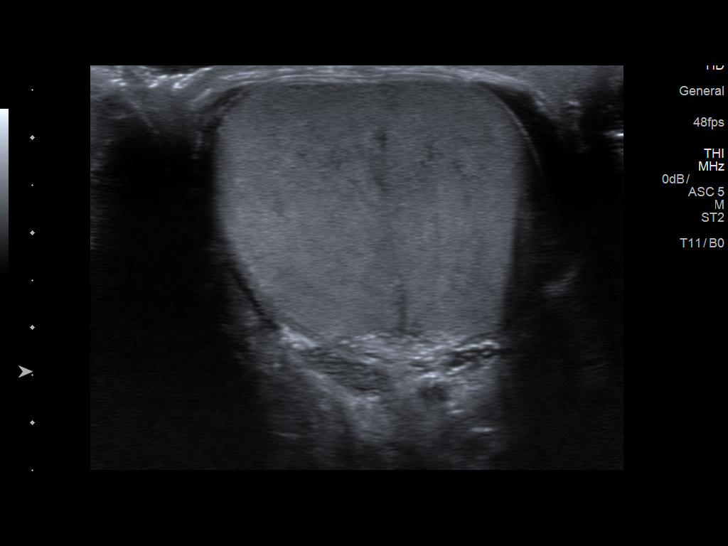
[im 28/84]
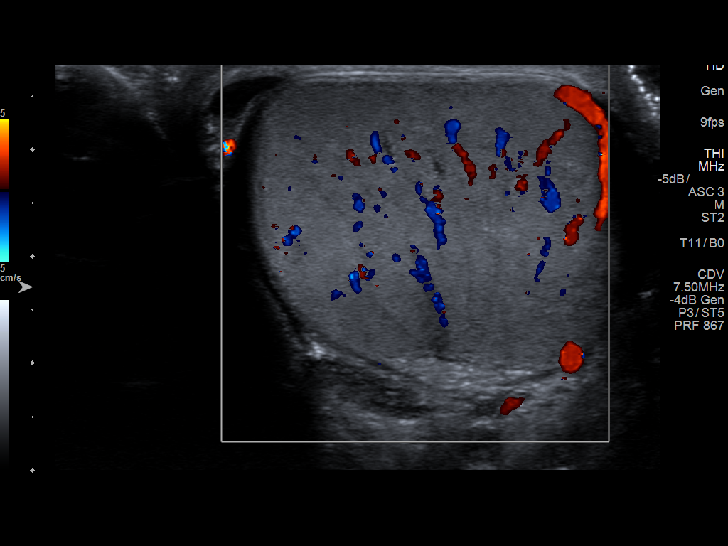
[im 35/84]
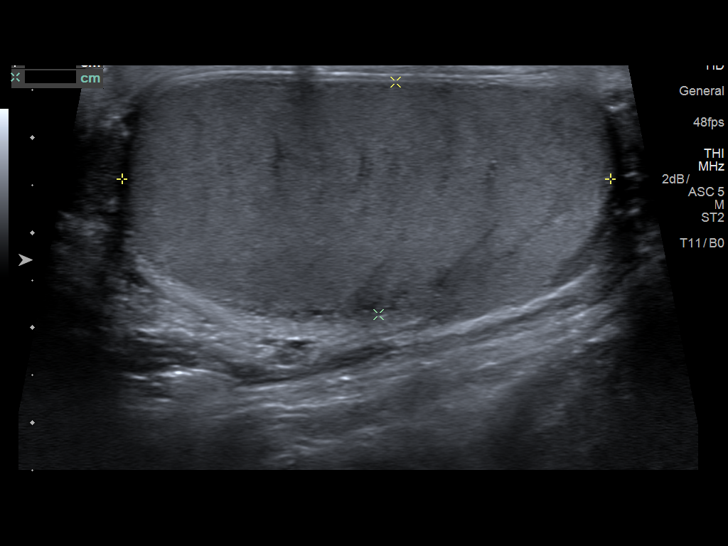
[im 42/84]
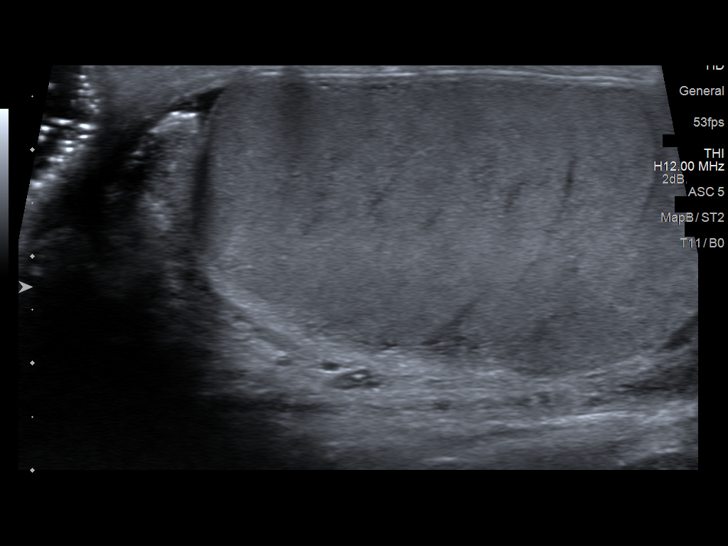
[im 49/84]
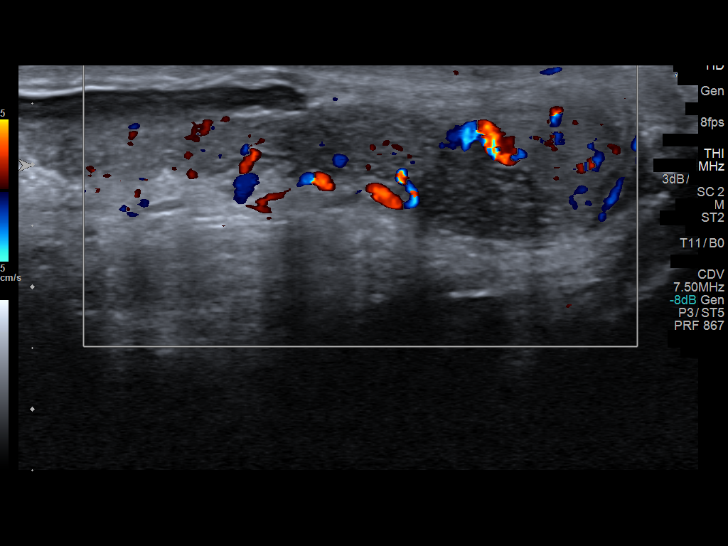
[im 56/84]
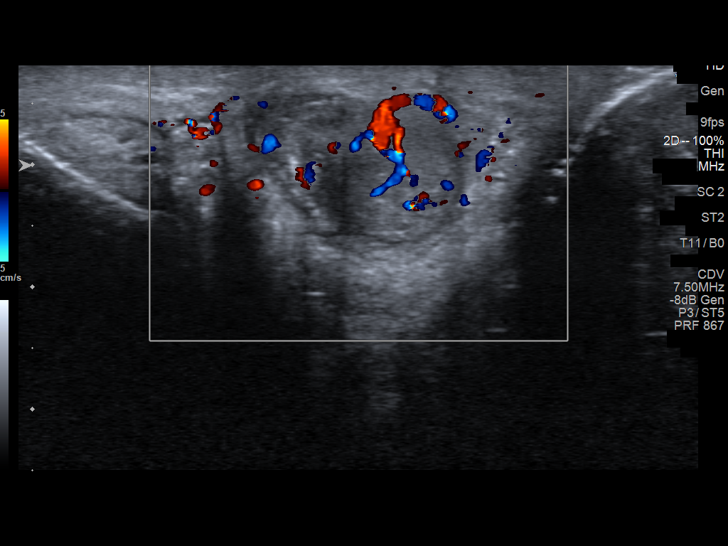
[im 63/84]
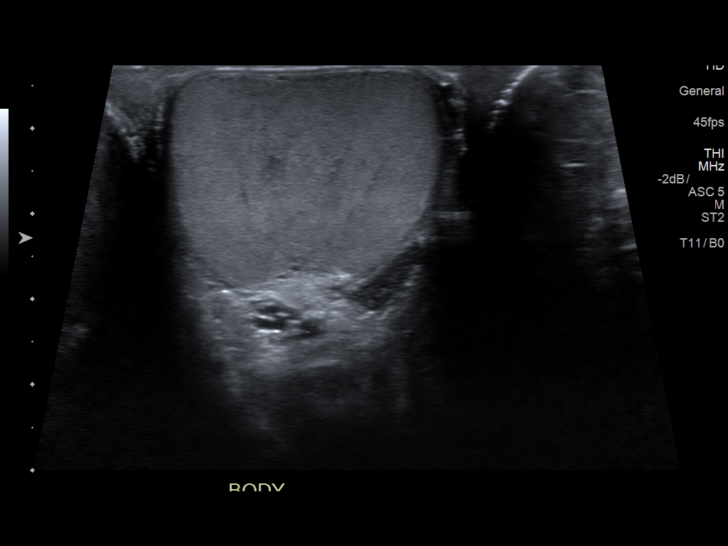
[im 70/84]
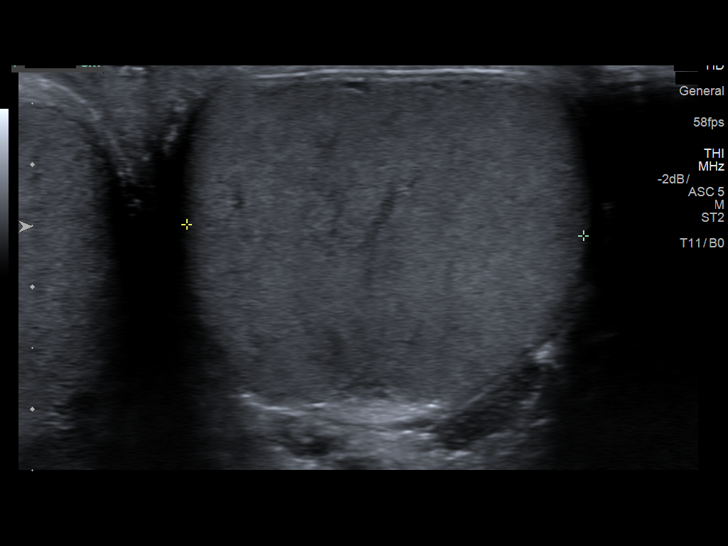
[im 77/84]
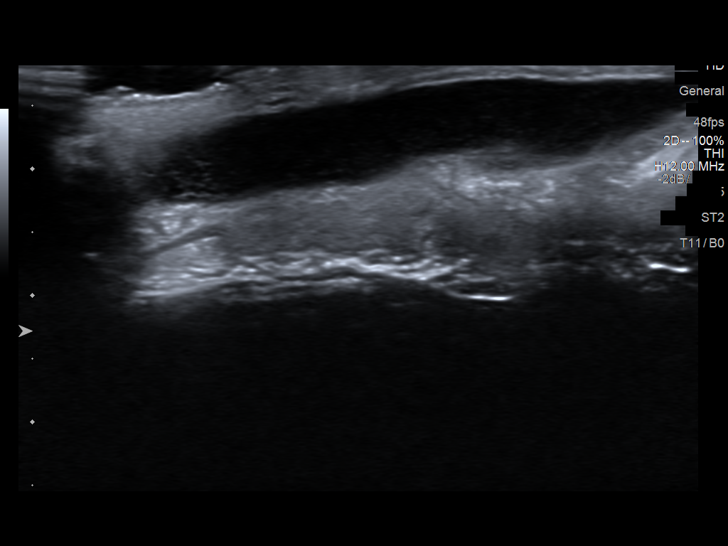
[im 84/84]
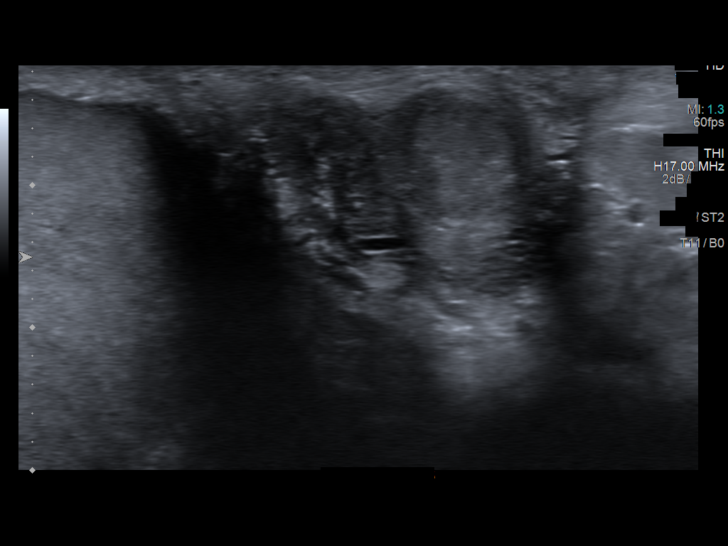

[13 of 25 positions shown; findings below may reference images not displayed]

FINDINGS: Right testicle

Measurements: 5.1 x 2.7 x 3.4 cm. No mass or microlithiasis
visualized.

Left testicle

Measurements: 5.1 x 2.5 x 3.3 cm. No mass or microlithiasis
visualized.

Right epididymis:  Normal in size and appearance.

Left epididymis: Normal in size. 5 x 5 x 4 mm simple cyst at the
left epididymal head. 9 x 8 x 9 mm heterogeneous isoechoic to
hypoechoic solid lesion at the left epididymal tail (image 62). This
appears to correspond with palpable abnormality of concern. Lesion
itself is fairly hypovascular in nature.

Hydrocele:  Small bilateral hydroceles.

Varicocele:  None visualized.

Pulsed Doppler interrogation of both testes demonstrates normal low
resistance arterial and venous waveforms bilaterally.
IMPRESSION: 1. 9 x 8 x 9 mm solid lesion at the left epididymal tail, which
appears to correspond with palpable abnormality of concern. While
this finding is indeterminate, this may reflect a small adenomatoid
tumor (typically the most commonly encountered solid extratesticular
lesion). A short interval follow-up ultrasound in 3 months to
evaluate for stability is recommended.
2. Additional 5 mm simple cyst at the left epididymal head, felt to
be benign and incidental in nature, and of no clinical significance.
3. Small bilateral hydroceles.

## 2020-03-21 ENCOUNTER — Other Ambulatory Visit: Payer: Self-pay | Admitting: Family Medicine

## 2020-04-06 ENCOUNTER — Ambulatory Visit (INDEPENDENT_AMBULATORY_CARE_PROVIDER_SITE_OTHER): Payer: BC Managed Care – PPO | Admitting: Family Medicine

## 2020-04-06 ENCOUNTER — Encounter: Payer: Self-pay | Admitting: Family Medicine

## 2020-04-06 ENCOUNTER — Other Ambulatory Visit: Payer: Self-pay

## 2020-04-06 VITALS — BP 104/62 | HR 73 | Temp 98.0°F | Ht 70.5 in | Wt 198.5 lb

## 2020-04-06 DIAGNOSIS — E1165 Type 2 diabetes mellitus with hyperglycemia: Secondary | ICD-10-CM | POA: Diagnosis not present

## 2020-04-06 NOTE — Patient Instructions (Addendum)
Give [place] 2-3 business days to get the results of your labs back.   Keep the diet clean and stay active.  Call your insurance company to see what glucose meter they cover is. Let us know and we can send it in.   I recommend getting the flu shot in mid October. This suggestion would change if the CDC comes out with a different recommendation.   Let us know if you need anything.

## 2020-04-06 NOTE — Progress Notes (Signed)
Subjective:   Chief Complaint  Patient presents with  . Follow-up    Jeremy Pierce is a 49 y.o. male here for follow-up of diabetes.   Jeremy Pierce does not routinely check his sugars. Patient does not require insulin.   Medications include: Metformin 1000 mg bid Diet is fair.  Exercise: walking  Past Medical History:  Diagnosis Date  . Elevated cholesterol      Related testing: Retinal exam: Done Pneumovax: done  Objective:  BP 104/62 (BP Location: Left Arm, Patient Position: Sitting, Cuff Size: Normal)   Pulse 73   Temp 98 F (36.7 C) (Oral)   Ht 5' 10.5" (1.791 m)   Wt 198 lb 8 oz (90 kg)   SpO2 96%   BMI 28.08 kg/m  General:  Well developed, well nourished, in no apparent distress Skin:  Warm, no pallor or diaphoresis Head:  Normocephalic, atraumatic Eyes:  Pupils equal and round, sclera anicteric without injection  Lungs:  CTAB, no access msc use Cardio:  RRR, no bruits, no LE edema Musculoskeletal:  Symmetrical muscle groups noted without atrophy or deformity Neuro:  Sensation intact to pinprick on feet Psych: Age appropriate judgment and insight  Assessment:   Type 2 diabetes mellitus with hyperglycemia, without long-term current use of insulin (HCC) - Plan: Hemoglobin A1c   Plan:   Counseled on diet and exercise. F/u in 3-6 mo pending above. The patient voiced understanding and agreement to the plan.  [place], DO 04/06/20 8:48 AM

## 2020-04-07 ENCOUNTER — Other Ambulatory Visit: Payer: Self-pay | Admitting: Family Medicine

## 2020-04-07 LAB — HEMOGLOBIN A1C
Hgb A1c MFr Bld: 6.2 %{Hb} — ABNORMAL HIGH
Mean Plasma Glucose: 131 (calc)
eAG (mmol/L): 7.3 (calc)

## 2020-04-07 MED ORDER — METFORMIN HCL 500 MG PO TABS: 1000.0000 mg | ORAL_TABLET | Freq: Two times a day (BID) | ORAL | 2 refills | Status: DC

## 2020-06-20 ENCOUNTER — Other Ambulatory Visit: Payer: Self-pay

## 2020-06-20 ENCOUNTER — Encounter (HOSPITAL_COMMUNITY): Payer: Self-pay

## 2020-06-20 ENCOUNTER — Ambulatory Visit (HOSPITAL_COMMUNITY)
Admission: EM | Admit: 2020-06-20 | Discharge: 2020-06-20 | Disposition: A | Discharge status: Home / Self Care | Payer: BC Managed Care – PPO | Attending: Family Medicine | Admitting: Family Medicine

## 2020-06-20 DIAGNOSIS — H6502 Acute serous otitis media, left ear: Secondary | ICD-10-CM

## 2020-06-20 MED ORDER — PREDNISONE 50 MG PO TABS
ORAL_TABLET | ORAL | 0 refills | Status: DC
Start: 2020-06-20 — End: 2020-07-05

## 2020-06-20 NOTE — ED Triage Notes (Signed)
Pt presents with left ear pain X 4 days.

## 2020-06-20 NOTE — ED Provider Notes (Signed)
[facility]    CSN: [id number] Arrival date & time: 06/20/20  1322      History   Chief Complaint Chief Complaint  Patient presents with  . Otalgia    HPI Madden Garron is a 49 y.o. male.   Presenting today with 4 day hx of left ear pressure, popping, and pain that sometimes radiates to jaw. Denies muffled hearing, headaches, congestion, fever, chills, N/V, dizziness. Has not been trying anything OTC for sxs. No known hx of seasonal allergies or recurrent ear infections.       Past Medical History:  Diagnosis Date  . Elevated cholesterol     Patient Active Problem List   Diagnosis Date Noted  . Prediabetes 07/07/2019  . Type 2 diabetes mellitus with hyperglycemia, without long-term current use of insulin ([place]) 07/07/2019  . Hyperlipidemia 07/02/2018    Past Surgical History:  Procedure Laterality Date  . NO PAST SURGERIES    . POLYPECTOMY     Urinary tract per patient  . VASECTOMY         Home Medications    Prior to Admission medications   Medication Sig Start Date End Date Taking? Authorizing Provider  metFORMIN (GLUCOPHAGE) 500 MG tablet Take 2 tablets (1,000 mg total) by mouth 2 (two) times daily with a meal. 04/07/20   Wendling, Crosby Oyster, DO  Multiple Vitamins-Minerals (MULTIVITAMIN WITH MINERALS) tablet Take 1 tablet by mouth daily.    [provider]  predniSONE (DELTASONE) 50 MG tablet Take 1 tab daily with breakfast 06/20/20   Volney American, PA-C  simvastatin (ZOCOR) 20 MG tablet TAKE 1 TABLET(20 MG) BY MOUTH AT BEDTIME 01/05/20   Wendling, Crosby Oyster, DO    Family History Family History  Problem Relation Age of Onset  . Cancer Mother        breast cancer and tongue cancer  . Diabetes Mother   . Kidney disease Father   . Heart disease Father   . Cancer Sister        breast  . Diabetes Maternal Grandfather     Social History Social History   Tobacco Use  . Smoking status: Former Research scientist (life sciences)  . Smokeless  tobacco: Never Used  Substance Use Topics  . Alcohol use: Yes    Comment: drinks beer--very seldom though  . Drug use: Never     Allergies   Patient has no known allergies.   Review of Systems Review of Systems PER HPI    Physical Exam Triage Vital Signs ED Triage Vitals  Enc Vitals Group     BP 06/20/20 1520 105/65     Pulse Rate 06/20/20 1520 73     Resp 06/20/20 1520 17     Temp 06/20/20 1520 97.8 F (36.6 C)     Temp Source 06/20/20 1520 Oral     SpO2 06/20/20 1520 99 %     Weight --      Height --      Head Circumference --      Peak Flow --      Pain Score 06/20/20 1518 6     Pain Loc --      Pain Edu? --      Excl. in [place]? --    No data found.  Updated Vital Signs BP 105/65 (BP Location: Left Arm)   Pulse 73   Temp 97.8 F (36.6 C) (Oral)   Resp 17   SpO2 99%   Visual Acuity Right Eye Distance:  Left Eye Distance:   Bilateral Distance:    Right Eye Near:   Left Eye Near:    Bilateral Near:     Physical Exam Vitals and nursing note reviewed.  Constitutional:      Appearance: Normal appearance.  HENT:     Head: Atraumatic.     Right Ear: Tympanic membrane normal.     Ears:     Comments: Small amount of dried blood at 6 o clock left EAC Left middle ear effusion present, no TM injection or erythema    Nose: Nose normal.     Mouth/Throat:     Mouth: Mucous membranes are moist.     Pharynx: Oropharynx is clear.  Eyes:     Extraocular Movements: Extraocular movements intact.     Conjunctiva/sclera: Conjunctivae normal.  Cardiovascular:     Rate and Rhythm: Normal rate and regular rhythm.  Pulmonary:     Effort: Pulmonary effort is normal.     Breath sounds: Normal breath sounds.  Abdominal:     General: Bowel sounds are normal. There is no distension.     Palpations: Abdomen is soft.     Tenderness: There is no abdominal tenderness.  Musculoskeletal:        General: Normal range of motion.     Cervical back: Normal range of motion  and neck supple.  Skin:    General: Skin is warm and dry.  Neurological:     General: No focal deficit present.     Mental Status: He is oriented to person, place, and time.  Psychiatric:        Mood and Affect: Mood normal.        Thought Content: Thought content normal.        Judgment: Judgment normal.      UC Treatments / Results  Labs (all labs ordered are listed, but only abnormal results are displayed) Labs Reviewed - No data to display  EKG   Radiology No results found.  Procedures Procedures (including critical care time)  Medications Ordered in UC Medications - No data to display  Initial Impression / Assessment and Plan / UC Course  I have reviewed the triage vital signs and the nursing notes.  Pertinent labs & imaging results that were available during my care of the patient were reviewed by me and considered in my medical decision making (see chart for details).     Tx with short prednisone burst, flonase BID prn. Discussed return precautions if worsening.   Final Clinical Impressions(s) / UC Diagnoses   Final diagnoses:  Non-recurrent acute serous otitis media of left ear   Discharge Instructions   None    ED Prescriptions    Medication Sig Dispense Auth. Provider   predniSONE (DELTASONE) 50 MG tablet Take 1 tab daily with breakfast 3 tablet Volney American, PA-C     PDMP not reviewed this encounter.   Volney American, [place] 06/20/20 1545

## 2020-07-05 ENCOUNTER — Other Ambulatory Visit: Payer: Self-pay

## 2020-07-05 ENCOUNTER — Encounter: Payer: Self-pay | Admitting: Family Medicine

## 2020-07-05 ENCOUNTER — Ambulatory Visit (INDEPENDENT_AMBULATORY_CARE_PROVIDER_SITE_OTHER): Payer: BC Managed Care – PPO | Admitting: Family Medicine

## 2020-07-05 VITALS — BP 104/72 | HR 93 | Temp 97.9°F | Ht 70.5 in | Wt 197.4 lb

## 2020-07-05 DIAGNOSIS — H9202 Otalgia, left ear: Secondary | ICD-10-CM | POA: Diagnosis not present

## 2020-07-05 MED ORDER — PREDNISONE 20 MG PO TABS: 40.0000 mg | ORAL_TABLET | Freq: Every day | ORAL | 0 refills | Status: AC

## 2020-07-05 NOTE — Progress Notes (Signed)
Chief Complaint  Patient presents with  . Ear Pain    Pt is here for left ear pain. Duration: 3 weeks Progression: unchanged Associated symptoms: jaw pain Denies: sore throat, congestion, post nasal drip, sneezing and sinus pressure, bleeding, or discharge from ear, fevers Treatment to date: Pred 50 mg/d for 3 d did not help  Past Medical History:  Diagnosis Date  . Elevated cholesterol     BP 104/72 (BP Location: Left Arm, Patient Position: Sitting, Cuff Size: Normal)   Pulse 93   Temp 97.9 F (36.6 C) (Oral)   Ht 5' 10.5" (1.791 m)   Wt 197 lb 6 oz (89.5 kg)   SpO2 99%   BMI 27.92 kg/m  General: Awake, alert, appearing stated age HEENT:  L ear- Canal patent without drainage or erythema, TM is neg R ear- canal patent without drainage or erythema, TM is neg Nose- nares patent and without discharge Mouth- Lips, gums and dentition unremarkable, pharynx is without erythema or exudate Neck: No adenopathy MSK: No ttp over b/l TMJ, no jaw deviation, no clicking Lungs: Normal effort, no accessory muscle use Psych: Age appropriate judgment and insight, normal mood and affect  Left ear pain - Plan: predniSONE (DELTASONE) 20 MG tablet  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 If no improvement after 1 week, will message and we will refer to ENT. F/u prn.  Pt voiced understanding and agreement to the plan.  [place], DO 07/05/20 4:30 PM

## 2020-07-05 NOTE — Patient Instructions (Addendum)
Heat (pad or rice pillow in microwave) over affected area, 10-15 minutes twice daily.   OK to take Tylenol 1000 mg (2 extra strength tabs) or 975 mg (3 regular strength tabs) every 6 hours as needed.  Send me a message in 7-10 days if no improvement.   Let us know if you need anything.

## 2020-07-06 ENCOUNTER — Ambulatory Visit: Payer: BC Managed Care – PPO | Admitting: Family Medicine

## 2020-09-05 DIAGNOSIS — H35363 Drusen (degenerative) of macula, bilateral: Secondary | ICD-10-CM | POA: Diagnosis not present

## 2020-09-08 DIAGNOSIS — M222X1 Patellofemoral disorders, right knee: Secondary | ICD-10-CM | POA: Diagnosis not present

## 2020-09-08 DIAGNOSIS — M222X2 Patellofemoral disorders, left knee: Secondary | ICD-10-CM | POA: Diagnosis not present

## 2020-09-12 DIAGNOSIS — M222X1 Patellofemoral disorders, right knee: Secondary | ICD-10-CM | POA: Diagnosis not present

## 2020-09-12 DIAGNOSIS — M222X2 Patellofemoral disorders, left knee: Secondary | ICD-10-CM | POA: Diagnosis not present

## 2020-09-14 DIAGNOSIS — M222X1 Patellofemoral disorders, right knee: Secondary | ICD-10-CM | POA: Diagnosis not present

## 2020-09-14 DIAGNOSIS — M222X2 Patellofemoral disorders, left knee: Secondary | ICD-10-CM | POA: Diagnosis not present

## 2020-09-21 DIAGNOSIS — M222X1 Patellofemoral disorders, right knee: Secondary | ICD-10-CM | POA: Diagnosis not present

## 2020-09-21 DIAGNOSIS — M222X2 Patellofemoral disorders, left knee: Secondary | ICD-10-CM | POA: Diagnosis not present

## 2020-09-23 DIAGNOSIS — M222X2 Patellofemoral disorders, left knee: Secondary | ICD-10-CM | POA: Diagnosis not present

## 2020-09-23 DIAGNOSIS — M222X1 Patellofemoral disorders, right knee: Secondary | ICD-10-CM | POA: Diagnosis not present

## 2020-09-27 DIAGNOSIS — M222X2 Patellofemoral disorders, left knee: Secondary | ICD-10-CM | POA: Diagnosis not present

## 2020-09-27 DIAGNOSIS — M222X1 Patellofemoral disorders, right knee: Secondary | ICD-10-CM | POA: Diagnosis not present

## 2020-09-30 DIAGNOSIS — M222X1 Patellofemoral disorders, right knee: Secondary | ICD-10-CM | POA: Diagnosis not present

## 2020-09-30 DIAGNOSIS — M222X2 Patellofemoral disorders, left knee: Secondary | ICD-10-CM | POA: Diagnosis not present

## 2020-10-03 DIAGNOSIS — M222X1 Patellofemoral disorders, right knee: Secondary | ICD-10-CM | POA: Diagnosis not present

## 2020-10-03 DIAGNOSIS — M222X2 Patellofemoral disorders, left knee: Secondary | ICD-10-CM | POA: Diagnosis not present

## 2020-10-05 DIAGNOSIS — M222X1 Patellofemoral disorders, right knee: Secondary | ICD-10-CM | POA: Diagnosis not present

## 2020-10-05 DIAGNOSIS — M222X2 Patellofemoral disorders, left knee: Secondary | ICD-10-CM | POA: Diagnosis not present

## 2020-10-11 ENCOUNTER — Ambulatory Visit (INDEPENDENT_AMBULATORY_CARE_PROVIDER_SITE_OTHER): Payer: BC Managed Care – PPO | Admitting: Family Medicine

## 2020-10-11 ENCOUNTER — Other Ambulatory Visit: Payer: Self-pay

## 2020-10-11 ENCOUNTER — Encounter: Payer: Self-pay | Admitting: Family Medicine

## 2020-10-11 VITALS — BP 110/68 | HR 84 | Temp 98.0°F | Resp 17 | Ht 70.5 in | Wt 194.0 lb

## 2020-10-11 DIAGNOSIS — Z1211 Encounter for screening for malignant neoplasm of colon: Secondary | ICD-10-CM

## 2020-10-11 DIAGNOSIS — E1165 Type 2 diabetes mellitus with hyperglycemia: Secondary | ICD-10-CM | POA: Diagnosis not present

## 2020-10-11 DIAGNOSIS — Z1159 Encounter for screening for other viral diseases: Secondary | ICD-10-CM

## 2020-10-11 DIAGNOSIS — E785 Hyperlipidemia, unspecified: Secondary | ICD-10-CM | POA: Diagnosis not present

## 2020-10-11 LAB — COMPREHENSIVE METABOLIC PANEL
ALT: 25 U/L (ref 0–53)
AST: 26 U/L (ref 0–37)
Albumin: 4.5 g/dL (ref 3.5–5.2)
Alkaline Phosphatase: 47 U/L (ref 39–117)
BUN: 16 mg/dL (ref 6–23)
CO2: 26 mEq/L (ref 19–32)
Calcium: 10 mg/dL (ref 8.4–10.5)
Chloride: 103 mEq/L (ref 96–112)
Creatinine, Ser: 1.27 mg/dL (ref 0.40–1.50)
GFR: 66.49 mL/min (ref >60.00)
Glucose, Bld: 108 mg/dL — ABNORMAL HIGH (ref 70–99)
Potassium: 4.5 mEq/L (ref 3.5–5.1)
Sodium: 138 mEq/L (ref 135–145)
Total Bilirubin: 0.6 mg/dL (ref 0.2–1.2)
Total Protein: 7 g/dL (ref 6.0–8.3)

## 2020-10-11 LAB — LIPID PANEL
Cholesterol: 146 mg/dL (ref 0–200)
HDL: 36.4 mg/dL — ABNORMAL LOW (ref >39.00)
NonHDL: 110.07
Total CHOL/HDL Ratio: 4
Triglycerides: 210 mg/dL — ABNORMAL HIGH (ref 0.0–149.0)
VLDL: 42 mg/dL — ABNORMAL HIGH (ref 0.0–40.0)

## 2020-10-11 LAB — HEMOGLOBIN A1C: Hgb A1c MFr Bld: 6.3 % (ref 4.6–6.5)

## 2020-10-11 LAB — MICROALBUMIN / CREATININE URINE RATIO
Creatinine,U: 153.6 mg/dL
Microalb Creat Ratio: 0.5 mg/g (ref 0.0–30.0)
Microalb, Ur: <0.7 mg/dL (ref 0.0–1.9)

## 2020-10-11 LAB — LDL CHOLESTEROL, DIRECT: Direct LDL: 78 mg/dL

## 2020-10-11 NOTE — Progress Notes (Signed)
Subjective:   Chief Complaint  Patient presents with  . Diabetes    6 month follow up     Jeremy Pierce is a 50 y.o. male here for follow-up of diabetes.   Donnelle does not monitor his sugars at home.  Patient does not require insulin.   Medications include: metformin 1000 mg bid Diet is healthy.  Exercise: walking, wt resistance exercise, elliptical  Hyperlipidemia Patient presents for dyslipidemia follow up. Currently being treated with Zocor 20 mg/d and compliance with treatment thus far has been good. He denies myalgias. Diet/exercise as above.  The patient denies CP or SOB  Past Medical History:  Diagnosis Date  . Elevated cholesterol      Related testing: Retinal exam: Done Pneumovax: done  Objective:  BP 110/68 (BP Location: Left Arm, Patient Position: Sitting, Cuff Size: Normal)   Pulse 84   Temp 98 F (36.7 C)   Resp 17   Ht 5' 10.5" (1.791 m)   Wt 194 lb (88 kg)   SpO2 97%   BMI 27.44 kg/m  General:  Well developed, well nourished, in no apparent distress Eyes:  Pupils equal and round, sclera anicteric without injection  Lungs:  CTAB, no access msc use Cardio:  RRR, no bruits, no LE edema Psych: Age appropriate judgment and insight  Assessment:   Type 2 diabetes mellitus with hyperglycemia, without long-term current use of insulin (HCC) - Plan: Comprehensive metabolic panel, Lipid panel, Hemoglobin A1c, Microalbumin / creatinine urine ratio  Hyperlipidemia, unspecified hyperlipidemia type  Screen for colon cancer - Plan: Ambulatory referral to Gastroenterology  Encounter for hepatitis C screening test for low risk patient - Plan: Hepatitis C antibody   Plan:   1. Cont Metformin 1000 mg bid. Counseled on diet and exercise. Doesn't need to monitor sugars as of now. 2. Cont Zocor 20 mg/d.  HM items ordered.  F/u in 6 mo for CPE or prn. The patient voiced understanding and agreement to the plan.  [place], DO 10/11/20 8:51  AM

## 2020-10-11 NOTE — Patient Instructions (Addendum)
Give us 2-3 business days to get the results of your labs back.   Keep the diet clean and stay active.  If you do not hear anything about your referral in the next 1-2 weeks, call our office and ask for an update.  Let us know if you need anything. 

## 2020-10-12 LAB — HEPATITIS C ANTIBODY
Hepatitis C Ab: NONREACTIVE
SIGNAL TO CUT-OFF: 0.01 (ref <1.00)

## 2020-11-27 DIAGNOSIS — Z8616 Personal history of COVID-19: Secondary | ICD-10-CM

## 2020-11-27 HISTORY — DX: Personal history of COVID-19: Z86.16

## 2020-12-12 ENCOUNTER — Ambulatory Visit (AMBULATORY_SURGERY_CENTER): Payer: Self-pay

## 2020-12-12 ENCOUNTER — Other Ambulatory Visit: Payer: Self-pay

## 2020-12-12 VITALS — Ht 70.5 in | Wt 195.0 lb

## 2020-12-12 DIAGNOSIS — Z1211 Encounter for screening for malignant neoplasm of colon: Secondary | ICD-10-CM

## 2020-12-12 MED ORDER — SUTAB 1479-225-188 MG PO TABS: 12.0000 | ORAL_TABLET | ORAL | 0 refills | Status: DC

## 2020-12-12 NOTE — Progress Notes (Signed)
No egg or soy allergy known to patient  No issues with past sedation with any surgeries or procedures Patient denies ever being told they had issues or difficulty with intubation  No FH of Malignant Hyperthermia No diet pills per patient No home 02 use per patient  No blood thinners per patient  Pt denies issues with constipation  No A fib or A flutter  EMMI video to pt or via [place] 19 guidelines implemented in [place] today with Pt and RN  Pt is fully vaccinated  for [employer] given to pt in PV today , Code to Pharmacy and  NO PA's for preps discussed with pt In PV today  Discussed with pt there will be an out-of-pocket cost for prep and that varies from $0 to 70 dollars   Due to the COVID-19 pandemic we are asking patients to follow certain guidelines.  Pt aware of COVID protocols and [facility] guidelines

## 2020-12-20 DIAGNOSIS — U071 COVID-19: Secondary | ICD-10-CM | POA: Diagnosis not present

## 2020-12-21 ENCOUNTER — Encounter: Payer: Self-pay | Admitting: Gastroenterology

## 2020-12-28 ENCOUNTER — Encounter: Payer: BC Managed Care – PPO | Admitting: Gastroenterology

## 2021-01-30 ENCOUNTER — Other Ambulatory Visit: Payer: Self-pay | Admitting: Family Medicine

## 2021-02-15 ENCOUNTER — Ambulatory Visit (AMBULATORY_SURGERY_CENTER): Payer: BC Managed Care – PPO | Admitting: *Deleted

## 2021-02-15 ENCOUNTER — Encounter: Payer: Self-pay | Admitting: *Deleted

## 2021-02-15 ENCOUNTER — Other Ambulatory Visit: Payer: Self-pay

## 2021-02-15 ENCOUNTER — Encounter: Payer: Self-pay | Admitting: Gastroenterology

## 2021-02-15 VITALS — Ht 70.5 in | Wt 195.0 lb

## 2021-02-15 DIAGNOSIS — Z1211 Encounter for screening for malignant neoplasm of colon: Secondary | ICD-10-CM

## 2021-02-15 NOTE — Progress Notes (Signed)
Patient's pre-visit was done today over the phone with the patient due to COVID-19 pandemic. Name,DOB and address verified. Insurance verified. Patient denies any allergies to Eggs and Soy. Patient denies any problems with anesthesia/sedation. Patient denies taking diet pills or blood thinners. No home Oxygen.  Prep instructions sent to pt's MyChart-pt is aware. Patient understands to call us back with any questions or concerns. Patient is aware of our care-partner policy and PRXYV-85 safety protocol. Patient denies any medical chart hx changes since last PV here. Patient has sutab at home.  EMMI education assigned to the patient for the procedure, sent to [facility].   The patient is COVID-19 vaccinated, per patient.

## 2021-02-18 ENCOUNTER — Other Ambulatory Visit: Payer: Self-pay | Admitting: Family Medicine

## 2021-02-27 DIAGNOSIS — M222X2 Patellofemoral disorders, left knee: Secondary | ICD-10-CM | POA: Diagnosis not present

## 2021-02-27 DIAGNOSIS — M222X1 Patellofemoral disorders, right knee: Secondary | ICD-10-CM | POA: Diagnosis not present

## 2021-02-27 DIAGNOSIS — M25561 Pain in right knee: Secondary | ICD-10-CM | POA: Diagnosis not present

## 2021-03-01 ENCOUNTER — Ambulatory Visit (AMBULATORY_SURGERY_CENTER): Payer: BC Managed Care – PPO | Admitting: Gastroenterology

## 2021-03-01 ENCOUNTER — Encounter: Payer: Self-pay | Admitting: Gastroenterology

## 2021-03-01 ENCOUNTER — Other Ambulatory Visit: Payer: Self-pay

## 2021-03-01 VITALS — BP 98/63 | HR 60 | Temp 98.6°F | Resp 13 | Ht 70.0 in | Wt 195.0 lb

## 2021-03-01 DIAGNOSIS — Z1211 Encounter for screening for malignant neoplasm of colon: Secondary | ICD-10-CM

## 2021-03-01 HISTORY — PX: COLONOSCOPY: SHX174

## 2021-03-01 MED ORDER — SODIUM CHLORIDE 0.9 % IV SOLN: 500.0000 mL | Freq: Once | INTRAVENOUS | Status: DC

## 2021-03-01 NOTE — Progress Notes (Signed)
PT taken to PACU. Monitors in place. VSS. Report given to RN. 

## 2021-03-01 NOTE — Patient Instructions (Signed)
Resume previous diet Continue current medications Repeat colonoscopy in 10 years  YOU HAD AN ENDOSCOPIC PROCEDURE TODAY AT [facility]:   Refer to the procedure report that was given to you for any specific questions about what was found during the examination.  If the procedure report does not answer your questions, please call your gastroenterologist to clarify.  If you requested that your care partner not be given the details of your procedure findings, then the procedure report has been included in a sealed envelope for you to review at your convenience later.  YOU SHOULD EXPECT: Some feelings of bloating in the abdomen. Passage of more gas than usual.  Walking can help get rid of the air that was put into your GI tract during the procedure and reduce the bloating. If you had a lower endoscopy (such as a colonoscopy or flexible sigmoidoscopy) you may notice spotting of blood in your stool or on the toilet paper. If you underwent a bowel prep for your procedure, you may not have a normal bowel movement for a few days.  Please Note:  You might notice some irritation and congestion in your nose or some drainage.  This is from the oxygen used during your procedure.  There is no need for concern and it should clear up in a day or so.  SYMPTOMS TO REPORT IMMEDIATELY:  Following lower endoscopy (colonoscopy or flexible sigmoidoscopy):  Excessive amounts of blood in the stool  Significant tenderness or worsening of abdominal pains  Swelling of the abdomen that is new, acute  Fever of 100F or higher  For urgent or emergent issues, a gastroenterologist can be reached at any hour by calling [phone]. Do not use MyChart messaging for urgent concerns.   DIET:  We do recommend a small meal at first, but then you may proceed to your regular diet.  Drink plenty of fluids but you should avoid alcoholic beverages for 24 hours.  ACTIVITY:  You should plan to take it easy for the rest  of today and you should NOT DRIVE or use heavy machinery until tomorrow (because of the sedation medicines used during the test).    FOLLOW UP: Our staff will call the number listed on your records 48-72 hours following your procedure to check on you and address any questions or concerns that you may have regarding the information given to you following your procedure. If we do not reach you, we will leave a message.  We will attempt to reach you two times.  During this call, we will ask if you have developed any symptoms of COVID 19. If you develop any symptoms (ie: fever, flu-like symptoms, shortness of breath, cough etc.) before then, please call [phone].  If you test positive for Covid 19 in the 2 weeks post procedure, please call and report this information to [place].    If any biopsies were taken you will be contacted by phone or by letter within the next 1-3 weeks.  Please call us at [phone] if you have not heard about the biopsies in 3 weeks.   SIGNATURES/CONFIDENTIALITY: You and/or your care partner have signed paperwork which will be entered into your electronic medical record.  These signatures attest to the fact that that the information above on your After Visit Summary has been reviewed and is understood.  Full responsibility of the confidentiality of this discharge information lies with you and/or your care-partner.

## 2021-03-01 NOTE — Op Note (Signed)
[facility] Patient Name: Jeremy Pierce Procedure Date: 03/01/2021 10:46 AM MRN: [id number] Endoscopist: Jackquline Denmark , MD Age: 50 Referring MD:  Date of Birth: [birth year] Gender: Male Account #: [id number] Procedure:                Colonoscopy Indications:              Screening for colorectal malignant neoplasm Medicines:                Monitored Anesthesia Care Procedure:                Pre-Anesthesia Assessment:                           - Prior to the procedure, a History and Physical                            was performed, and patient medications and                            allergies were reviewed. The patient's tolerance of                            previous anesthesia was also reviewed. The risks                            and benefits of the procedure and the sedation                            options and risks were discussed with the patient.                            All questions were answered, and informed consent                            was obtained. Prior Anticoagulants: The patient has                            taken no previous anticoagulant or antiplatelet                            agents. ASA Grade Assessment: II - A patient with                            mild systemic disease. After reviewing the risks                            and benefits, the patient was deemed in                            satisfactory condition to undergo the procedure.                           After obtaining informed consent, the colonoscope  was passed under direct vision. Throughout the                            procedure, the patient's blood pressure, pulse, and                            oxygen saturations were monitored continuously. The                            CF [id number] was introduced through the anus                            and advanced to the the cecum, identified by                            appendiceal orifice and  ileocecal valve. The                            colonoscopy was performed without difficulty. The                            patient tolerated the procedure well. The quality                            of the bowel preparation was good. The ileocecal                            valve, appendiceal orifice, and rectum were                            photographed. Scope In: 10:51:46 AM Scope Out: 11:04:27 AM Scope Withdrawal Time: 0 hours 9 minutes 41 seconds  Total Procedure Duration: 0 hours 12 minutes 41 seconds  Findings:                 The colon (entire examined portion) appeared normal.                           Non-bleeding internal hemorrhoids were found during                            retroflexion. The hemorrhoids were small.                           The exam was otherwise without abnormality on                            direct and retroflexion views. Complications:            No immediate complications. Estimated Blood Loss:     Estimated blood loss: none. Impression:               - Non-bleeding internal hemorrhoids.                           - The examination was otherwise normal on direct  and retroflexion views.                           - No specimens collected. Recommendation:           - Patient has a contact number available for                            emergencies. The signs and symptoms of potential                            delayed complications were discussed with the                            patient. Return to normal activities tomorrow.                            Written discharge instructions were provided to the                            patient.                           - Resume previous diet.                           - Continue present medications.                           - Repeat colonoscopy in 10 years for screening                            purposes. Earlier, if with any new problems or                             change in family history.                           - The findings and recommendations were discussed                            with the patient's family. Jackquline Denmark, MD 03/01/2021 11:07:22 AM This report has been signed electronically.

## 2021-03-01 NOTE — Progress Notes (Signed)
Pt's states no medical or surgical changes since previsit or office visit. Vs by CW.

## 2021-03-03 ENCOUNTER — Telehealth: Payer: Self-pay

## 2021-03-03 NOTE — Telephone Encounter (Signed)
  Follow up Call-  Call back number 03/01/2021  Post procedure Call Back phone  # [phone]  Permission to leave phone message Yes  Some recent data might be hidden     Patient questions:  Do you have a fever, pain , or abdominal swelling? No. Pain Score  0 *  Have you tolerated food without any problems? Yes.    Have you been able to return to your normal activities? Yes.    Do you have any questions about your discharge instructions: Diet   No. Medications  No. Follow up visit  No.  Do you have questions or concerns about your Care? No.  Actions: * If pain score is 4 or above: No action needed, pain <4.

## 2021-03-17 DIAGNOSIS — M25561 Pain in right knee: Secondary | ICD-10-CM | POA: Diagnosis not present

## 2021-03-24 DIAGNOSIS — S83241A Other tear of medial meniscus, current injury, right knee, initial encounter: Secondary | ICD-10-CM | POA: Diagnosis not present

## 2021-03-30 DIAGNOSIS — H9202 Otalgia, left ear: Secondary | ICD-10-CM

## 2021-03-30 HISTORY — DX: Otalgia, left ear: H92.02

## 2021-04-14 ENCOUNTER — Encounter: Payer: Self-pay | Admitting: Family Medicine

## 2021-04-14 ENCOUNTER — Other Ambulatory Visit: Payer: Self-pay

## 2021-04-14 ENCOUNTER — Ambulatory Visit (INDEPENDENT_AMBULATORY_CARE_PROVIDER_SITE_OTHER): Payer: BC Managed Care – PPO | Admitting: Family Medicine

## 2021-04-14 VITALS — BP 108/68 | HR 73 | Temp 98.0°F | Ht 70.5 in | Wt 192.0 lb

## 2021-04-14 DIAGNOSIS — Z125 Encounter for screening for malignant neoplasm of prostate: Secondary | ICD-10-CM | POA: Diagnosis not present

## 2021-04-14 DIAGNOSIS — E1165 Type 2 diabetes mellitus with hyperglycemia: Secondary | ICD-10-CM | POA: Diagnosis not present

## 2021-04-14 DIAGNOSIS — Z0000 Encounter for general adult medical examination without abnormal findings: Secondary | ICD-10-CM

## 2021-04-14 LAB — CBC
HCT: 44.4 % (ref 39.0–52.0)
Hemoglobin: 14.7 g/dL (ref 13.0–17.0)
MCHC: 33.2 g/dL (ref 30.0–36.0)
MCV: 87.9 fl (ref 78.0–100.0)
Platelets: 237 10*3/uL (ref 150.0–400.0)
RBC: 5.05 Mil/uL (ref 4.22–5.81)
RDW: 14.4 % (ref 11.5–15.5)
WBC: 7.2 10*3/uL (ref 4.0–10.5)

## 2021-04-14 LAB — COMPREHENSIVE METABOLIC PANEL
ALT: 18 U/L (ref 0–53)
AST: 17 U/L (ref 0–37)
Albumin: 4.8 g/dL (ref 3.5–5.2)
Alkaline Phosphatase: 47 U/L (ref 39–117)
BUN: 15 mg/dL (ref 6–23)
CO2: 25 mEq/L (ref 19–32)
Calcium: 10.1 mg/dL (ref 8.4–10.5)
Chloride: 103 mEq/L (ref 96–112)
Creatinine, Ser: 1.22 mg/dL (ref 0.40–1.50)
GFR: 69.53 mL/min (ref >60.00)
Glucose, Bld: 101 mg/dL — ABNORMAL HIGH (ref 70–99)
Potassium: 4.4 mEq/L (ref 3.5–5.1)
Sodium: 138 mEq/L (ref 135–145)
Total Bilirubin: 0.5 mg/dL (ref 0.2–1.2)
Total Protein: 7.2 g/dL (ref 6.0–8.3)

## 2021-04-14 LAB — LIPID PANEL
Cholesterol: 174 mg/dL (ref 0–200)
HDL: 38.8 mg/dL — ABNORMAL LOW (ref >39.00)
NonHDL: 134.96
Total CHOL/HDL Ratio: 4
Triglycerides: 288 mg/dL — ABNORMAL HIGH (ref 0.0–149.0)
VLDL: 57.6 mg/dL — ABNORMAL HIGH (ref 0.0–40.0)

## 2021-04-14 LAB — HEMOGLOBIN A1C: Hgb A1c MFr Bld: 6.5 % (ref 4.6–6.5)

## 2021-04-14 LAB — LDL CHOLESTEROL, DIRECT: Direct LDL: 95 mg/dL

## 2021-04-14 LAB — PSA: PSA: 0.47 ng/mL (ref 0.10–4.00)

## 2021-04-14 MED ORDER — FLUTICASONE PROPIONATE 50 MCG/ACT NA SUSP: 2.0000 | Freq: Every day | NASAL | 2 refills | Status: DC

## 2021-04-14 NOTE — Progress Notes (Addendum)
Chief Complaint  Patient presents with   Annual Exam   Ear Pain    left    Well Male Jeremy Pierce is here for a complete physical.   His last physical was >1 year ago.  Current diet: in general, a "healthy" diet.   Current exercise: walking Weight trend: stable Fatigue out of ordinary? No. Seat belt? Yes.    Health maintenance Tetanus- Yes HIV- Yes Hep C- Yes  Past Medical History:  Diagnosis Date   Diabetes mellitus without complication (HCC)    Elevated cholesterol      Past Surgical History:  Procedure Laterality Date   COLONOSCOPY  2007   normal    POLYPECTOMY     Urinary tract per patient   URETERAL BIOPSY Left 1982   benign growth   VASECTOMY     WRIST FRACTURE SURGERY Right 1987    Medications  Current Outpatient Medications on File Prior to Visit  Medication Sig Dispense Refill   Cyanocobalamin (VITAMIN B 12 PO) Take by mouth.     metFORMIN (GLUCOPHAGE) 500 MG tablet TAKE 2 TABLETS(1000 MG) BY MOUTH TWICE DAILY WITH A MEAL 360 tablet 2   Multiple Vitamins-Minerals (MULTIVITAMIN WITH MINERALS) tablet Take 1 tablet by mouth daily.     simvastatin (ZOCOR) 20 MG tablet TAKE 1 TABLET(20 MG) BY MOUTH AT BEDTIME 90 tablet 3   VITAMIN D PO Take by mouth.     Allergies No Known Allergies  Family History Family History  Problem Relation Age of Onset   Cancer Mother        breast cancer and tongue cancer   Diabetes Mother    Kidney disease Father    Heart disease Father    Cancer Sister        breast   Diabetes Maternal Grandfather    Colon polyps Maternal Grandfather    Colon cancer Neg Hx    Esophageal cancer Neg Hx    Rectal cancer Neg Hx    Stomach cancer Neg Hx    Review of Systems: Constitutional: no fevers or chills Eye:  no recent significant change in vision Ear/Nose/Mouth/Throat:  Ears:  no recent hearing loss, +intermittent L ear pain Nose/Mouth/Throat:  no complaints of nasal congestion, no sore throat Cardiovascular:  no chest  pain Respiratory:  no shortness of breath Gastrointestinal:  no abdominal pain, no change in bowel habits GU:  Male: negative for dysuria, frequency, and incontinence Musculoskeletal/Extremities:  no new pain of the joints Integumentary (Skin/Breast):  no abnormal skin lesions reported Neurologic:  no headaches Endocrine: No unexpected weight changes Hematologic/Lymphatic:  no night sweats  Exam BP 108/68   Pulse 73   Temp 98 F (36.7 C) (Oral)   Ht 5' 10.5" (1.791 m)   Wt 192 lb (87.1 kg)   SpO2 99%   BMI 27.16 kg/m  General:  well developed, well nourished, in no apparent distress Skin:  no significant moles, warts, or growths Head:  no masses, lesions, or tenderness Eyes:  pupils equal and round, sclera anicteric without injection Ears:  canals without lesions, TMs shiny without retraction, no obvious effusion, no erythema Nose:  nares patent, septum midline, mucosa normal Throat/Pharynx:  lips and gingiva without lesion; tongue and uvula midline; non-inflamed pharynx; no exudates or postnasal drainage Neck: neck supple without adenopathy, thyromegaly, or masses Lungs:  clear to auscultation, breath sounds equal bilaterally, no respiratory distress Cardio:  regular rate and rhythm, no bruits, no LE edema Abdomen:  abdomen soft, nontender; bowel  sounds normal; no masses or organomegaly Rectal: Deferred Musculoskeletal:  symmetrical muscle groups noted without atrophy or deformity Extremities:  no clubbing, cyanosis, or edema, no deformities, no skin discoloration Neuro:  gait normal; deep tendon reflexes normal and symmetric Psych: well oriented with normal range of affect and appropriate judgment/insight  Assessment and Plan  Well adult exam - Plan: CBC, Comprehensive metabolic panel, Lipid panel  Screening PSA (prostate specific antigen) - Plan: PSA  Type 2 diabetes mellitus with hyperglycemia, without long-term current use of insulin (HCC) - Plan: Hemoglobin A1c    Well 50 y.o. male. Counseled on diet and exercise. Counseled on risks and benefits of prostate cancer screening with PSA. The patient agrees to undergo screening.  Other orders as above. Flu shot rec'd for mid Oct. Covid bivalent booster rec'd when it becomes available.  Follow up in 6 mo pending the above workup. The patient voiced understanding and agreement to the plan.  [place], DO 04/14/21 8:36 AM

## 2021-04-14 NOTE — Patient Instructions (Addendum)
Give [place] 2-3 business days to get the results of your labs back.   Keep the diet clean and stay active.  I recommend getting the flu shot in mid October. This suggestion would change if the CDC comes out with a different recommendation.   Flonase (fluticasone); nasal spray that is over the counter. 2 sprays each nostril, once daily. Aim towards the same side eye when you spray.  Let us know if you need anything.

## 2021-04-17 ENCOUNTER — Other Ambulatory Visit: Payer: Self-pay | Admitting: Family Medicine

## 2021-04-17 DIAGNOSIS — Z8349 Family history of other endocrine, nutritional and metabolic diseases: Secondary | ICD-10-CM

## 2021-04-17 DIAGNOSIS — E785 Hyperlipidemia, unspecified: Secondary | ICD-10-CM

## 2021-04-17 MED ORDER — FENOFIBRATE 48 MG PO TABS
48.0000 mg | ORAL_TABLET | Freq: Every day | ORAL | 3 refills | Status: DC
Start: 2021-04-17 — End: 2021-09-08

## 2021-04-20 ENCOUNTER — Encounter (HOSPITAL_BASED_OUTPATIENT_CLINIC_OR_DEPARTMENT_OTHER): Payer: Self-pay | Admitting: Orthopedic Surgery

## 2021-04-20 ENCOUNTER — Other Ambulatory Visit: Payer: Self-pay

## 2021-04-27 ENCOUNTER — Ambulatory Visit (HOSPITAL_BASED_OUTPATIENT_CLINIC_OR_DEPARTMENT_OTHER): Payer: BC Managed Care – PPO | Admitting: Anesthesiology

## 2021-04-27 ENCOUNTER — Encounter (HOSPITAL_BASED_OUTPATIENT_CLINIC_OR_DEPARTMENT_OTHER)
Admission: RE | Disposition: A | Discharge status: Home / Self Care | Payer: Self-pay | Source: Home / Self Care | Attending: Orthopedic Surgery

## 2021-04-27 ENCOUNTER — Ambulatory Visit (HOSPITAL_BASED_OUTPATIENT_CLINIC_OR_DEPARTMENT_OTHER)
Admission: RE | Admit: 2021-04-27 | Discharge: 2021-04-27 | Disposition: A | Discharge status: Home / Self Care | Payer: BC Managed Care – PPO | Attending: Orthopedic Surgery | Admitting: Orthopedic Surgery

## 2021-04-27 ENCOUNTER — Encounter (HOSPITAL_BASED_OUTPATIENT_CLINIC_OR_DEPARTMENT_OTHER): Payer: Self-pay | Admitting: Orthopedic Surgery

## 2021-04-27 DIAGNOSIS — E1165 Type 2 diabetes mellitus with hyperglycemia: Secondary | ICD-10-CM | POA: Diagnosis not present

## 2021-04-27 DIAGNOSIS — Z7984 Long term (current) use of oral hypoglycemic drugs: Secondary | ICD-10-CM | POA: Insufficient documentation

## 2021-04-27 DIAGNOSIS — Z79899 Other long term (current) drug therapy: Secondary | ICD-10-CM | POA: Insufficient documentation

## 2021-04-27 DIAGNOSIS — Z87891 Personal history of nicotine dependence: Secondary | ICD-10-CM | POA: Diagnosis not present

## 2021-04-27 DIAGNOSIS — Z8616 Personal history of COVID-19: Secondary | ICD-10-CM | POA: Diagnosis not present

## 2021-04-27 DIAGNOSIS — X58XXXA Exposure to other specified factors, initial encounter: Secondary | ICD-10-CM | POA: Insufficient documentation

## 2021-04-27 DIAGNOSIS — S83241A Other tear of medial meniscus, current injury, right knee, initial encounter: Secondary | ICD-10-CM | POA: Diagnosis not present

## 2021-04-27 DIAGNOSIS — E782 Mixed hyperlipidemia: Secondary | ICD-10-CM | POA: Insufficient documentation

## 2021-04-27 DIAGNOSIS — E119 Type 2 diabetes mellitus without complications: Secondary | ICD-10-CM | POA: Diagnosis not present

## 2021-04-27 DIAGNOSIS — M84361A Stress fracture, right tibia, initial encounter for fracture: Secondary | ICD-10-CM | POA: Diagnosis not present

## 2021-04-27 DIAGNOSIS — S83231A Complex tear of medial meniscus, current injury, right knee, initial encounter: Secondary | ICD-10-CM | POA: Diagnosis not present

## 2021-04-27 DIAGNOSIS — M6588 Other synovitis and tenosynovitis, other site: Secondary | ICD-10-CM | POA: Diagnosis not present

## 2021-04-27 DIAGNOSIS — M94261 Chondromalacia, right knee: Secondary | ICD-10-CM | POA: Diagnosis not present

## 2021-04-27 DIAGNOSIS — M659 Synovitis and tenosynovitis, unspecified: Secondary | ICD-10-CM | POA: Diagnosis not present

## 2021-04-27 DIAGNOSIS — M65861 Other synovitis and tenosynovitis, right lower leg: Secondary | ICD-10-CM | POA: Diagnosis not present

## 2021-04-27 HISTORY — DX: Type 2 diabetes mellitus without complications: E11.9

## 2021-04-27 HISTORY — PX: KNEE ARTHROSCOPY WITH SUBCHONDROPLASTY: SHX6732

## 2021-04-27 HISTORY — DX: Male erectile dysfunction, unspecified: N52.9

## 2021-04-27 HISTORY — DX: Unspecified tear of unspecified meniscus, current injury, right knee, initial encounter: S83.206A

## 2021-04-27 HISTORY — DX: Mixed hyperlipidemia: E78.2

## 2021-04-27 HISTORY — DX: Presence of spectacles and contact lenses: Z97.3

## 2021-04-27 LAB — POCT I-STAT, CHEM 8
BUN: 21 mg/dL — ABNORMAL HIGH (ref 6–20)
Calcium, Ion: 1.13 mmol/L — ABNORMAL LOW (ref 1.15–1.40)
Chloride: 105 mmol/L (ref 98–111)
Creatinine, Ser: 1.1 mg/dL (ref 0.61–1.24)
Glucose, Bld: 115 mg/dL — ABNORMAL HIGH (ref 70–99)
HCT: 45 % (ref 39.0–52.0)
Hemoglobin: 15.3 g/dL (ref 13.0–17.0)
Potassium: 4.3 mmol/L (ref 3.5–5.1)
Sodium: 142 mmol/L (ref 135–145)
TCO2: 24 mmol/L (ref 22–32)

## 2021-04-27 LAB — GLUCOSE, CAPILLARY: Glucose-Capillary: 103 mg/dL — ABNORMAL HIGH (ref 70–99)

## 2021-04-27 SURGERY — ARTHROSCOPY, KNEE, WITH SUBCHONDROPLASTY
Anesthesia: General | Site: Knee | Laterality: Right

## 2021-04-27 MED ORDER — BUPIVACAINE HCL 0.25 % IJ SOLN
INTRAMUSCULAR | Status: DC | PRN
  Administered 2021-04-27: 20 mL

## 2021-04-27 MED ORDER — PROMETHAZINE HCL 25 MG/ML IJ SOLN: 6.2500 mg | INTRAMUSCULAR | Status: DC | PRN

## 2021-04-27 MED ORDER — KETOROLAC TROMETHAMINE 30 MG/ML IJ SOLN
INTRAMUSCULAR | Status: AC
  Filled 2021-04-27: qty 1

## 2021-04-27 MED ORDER — ONDANSETRON HCL 4 MG PO TABS: 4.0000 mg | ORAL_TABLET | Freq: Three times a day (TID) | ORAL | 1 refills | Status: DC | PRN

## 2021-04-27 MED ORDER — LIDOCAINE HCL (PF) 2 % IJ SOLN
INTRAMUSCULAR | Status: AC
  Filled 2021-04-27: qty 5

## 2021-04-27 MED ORDER — LACTATED RINGERS IV SOLN: INTRAVENOUS | Status: DC

## 2021-04-27 MED ORDER — PROPOFOL 10 MG/ML IV BOLUS
INTRAVENOUS | Status: AC
  Filled 2021-04-27: qty 20

## 2021-04-27 MED ORDER — MIDAZOLAM HCL 5 MG/5ML IJ SOLN
INTRAMUSCULAR | Status: DC | PRN
  Administered 2021-04-27: 2 mg via INTRAVENOUS

## 2021-04-27 MED ORDER — DROPERIDOL 2.5 MG/ML IJ SOLN: 0.6250 mg | Freq: Once | INTRAMUSCULAR | Status: DC | PRN

## 2021-04-27 MED ORDER — LIDOCAINE HCL (CARDIAC) PF 100 MG/5ML IV SOSY
PREFILLED_SYRINGE | INTRAVENOUS | Status: DC | PRN
  Administered 2021-04-27: 100 mg via INTRAVENOUS

## 2021-04-27 MED ORDER — HYDROCODONE-ACETAMINOPHEN 5-325 MG PO TABS: 1.0000 | ORAL_TABLET | ORAL | 0 refills | Status: DC | PRN

## 2021-04-27 MED ORDER — SODIUM CHLORIDE 0.9 % IR SOLN
Status: DC | PRN
  Administered 2021-04-27: 3000 mL

## 2021-04-27 MED ORDER — FENTANYL CITRATE (PF) 100 MCG/2ML IJ SOLN: 25.0000 ug | INTRAMUSCULAR | Status: DC | PRN

## 2021-04-27 MED ORDER — DEXAMETHASONE SODIUM PHOSPHATE 4 MG/ML IJ SOLN
INTRAMUSCULAR | Status: DC | PRN
  Administered 2021-04-27: 10 mg via INTRAVENOUS

## 2021-04-27 MED ORDER — OXYCODONE HCL 5 MG PO TABS
10.0000 mg | ORAL_TABLET | Freq: Once | ORAL | Status: AC | PRN
  Administered 2021-04-27: 10 mg via ORAL

## 2021-04-27 MED ORDER — ONDANSETRON HCL 4 MG/2ML IJ SOLN
INTRAMUSCULAR | Status: AC
  Filled 2021-04-27: qty 2

## 2021-04-27 MED ORDER — MIDAZOLAM HCL 2 MG/2ML IJ SOLN
INTRAMUSCULAR | Status: AC
  Filled 2021-04-27: qty 2

## 2021-04-27 MED ORDER — OXYCODONE HCL 5 MG/5ML PO SOLN: 10.0000 mg | Freq: Once | ORAL | Status: AC | PRN

## 2021-04-27 MED ORDER — FENTANYL CITRATE (PF) 100 MCG/2ML IJ SOLN
INTRAMUSCULAR | Status: AC
  Filled 2021-04-27: qty 2

## 2021-04-27 MED ORDER — ACETAMINOPHEN 500 MG PO TABS
ORAL_TABLET | ORAL | Status: AC
  Filled 2021-04-27: qty 2

## 2021-04-27 MED ORDER — PROPOFOL 10 MG/ML IV BOLUS
INTRAVENOUS | Status: DC | PRN
  Administered 2021-04-27: 200 mg via INTRAVENOUS

## 2021-04-27 MED ORDER — FENTANYL CITRATE (PF) 100 MCG/2ML IJ SOLN
INTRAMUSCULAR | Status: DC | PRN
  Administered 2021-04-27: 25 ug via INTRAVENOUS
  Administered 2021-04-27 (×2): 50 ug via INTRAVENOUS
  Administered 2021-04-27: 25 ug via INTRAVENOUS
  Administered 2021-04-27: 50 ug via INTRAVENOUS

## 2021-04-27 MED ORDER — CEFAZOLIN SODIUM-DEXTROSE 2-4 GM/100ML-% IV SOLN
INTRAVENOUS | Status: AC
  Filled 2021-04-27: qty 100

## 2021-04-27 MED ORDER — DEXAMETHASONE SODIUM PHOSPHATE 10 MG/ML IJ SOLN
INTRAMUSCULAR | Status: AC
  Filled 2021-04-27: qty 1

## 2021-04-27 MED ORDER — CEFAZOLIN SODIUM-DEXTROSE 2-4 GM/100ML-% IV SOLN
2.0000 g | INTRAVENOUS | Status: AC
  Administered 2021-04-27: 2 g via INTRAVENOUS

## 2021-04-27 MED ORDER — ACETAMINOPHEN 500 MG PO TABS
1000.0000 mg | ORAL_TABLET | Freq: Once | ORAL | Status: AC
  Administered 2021-04-27: 1000 mg via ORAL

## 2021-04-27 MED ORDER — OXYCODONE HCL 5 MG PO TABS
ORAL_TABLET | ORAL | Status: AC
  Filled 2021-04-27: qty 2

## 2021-04-27 MED ORDER — 0.9 % SODIUM CHLORIDE (POUR BTL) OPTIME
TOPICAL | Status: DC | PRN
Start: 2021-04-27 — End: 2021-04-27
  Administered 2021-04-27: 500 mL

## 2021-04-27 MED ORDER — ONDANSETRON HCL 4 MG/2ML IJ SOLN
INTRAMUSCULAR | Status: DC | PRN
  Administered 2021-04-27: 4 mg via INTRAVENOUS

## 2021-04-27 MED ORDER — KETOROLAC TROMETHAMINE 30 MG/ML IJ SOLN
INTRAMUSCULAR | Status: DC | PRN
  Administered 2021-04-27: 30 mg via INTRAVENOUS

## 2021-04-27 SURGICAL SUPPLY — 30 items
BLADE SHAVER TORPEDO 4X13 (MISCELLANEOUS) ×2 IMPLANT
BNDG ELASTIC 6X5.8 VLCR STR LF (GAUZE/BANDAGES/DRESSINGS) ×2 IMPLANT
BNDG GAUZE ELAST 4 BULKY (GAUZE/BANDAGES/DRESSINGS) ×2 IMPLANT
CLSR STERI-STRIP ANTIMIC 1/2X4 (GAUZE/BANDAGES/DRESSINGS) ×2 IMPLANT
COVER MAYO STAND STRL (DRAPES) ×2 IMPLANT
DRAPE ARTHROSCOPY W/POUCH 114 (DRAPES) ×2 IMPLANT
DRAPE U-SHAPE 47X51 STRL (DRAPES) ×2 IMPLANT
DRSG PAD ABDOMINAL 8X10 ST (GAUZE/BANDAGES/DRESSINGS) ×2 IMPLANT
DURAPREP 26ML APPLICATOR (WOUND CARE) ×2 IMPLANT
GAUZE 4X4 16PLY ~~LOC~~+RFID DBL (SPONGE) ×2 IMPLANT
GAUZE SPONGE 4X4 12PLY STRL (GAUZE/BANDAGES/DRESSINGS) ×2 IMPLANT
GAUZE XEROFORM 1X8 LF (GAUZE/BANDAGES/DRESSINGS) ×2 IMPLANT
GLOVE SRG 8 PF TXTR STRL LF DI (GLOVE) ×2 IMPLANT
GLOVE SURG ENC MOIS LTX SZ7.5 (GLOVE) ×4 IMPLANT
GLOVE SURG UNDER POLY LF SZ7 (GLOVE) ×6 IMPLANT
GLOVE SURG UNDER POLY LF SZ8 (GLOVE) ×4
GOWN STRL REUS W/TWL LRG LVL3 (GOWN DISPOSABLE) ×2 IMPLANT
GOWN STRL REUS W/TWL XL LVL3 (GOWN DISPOSABLE) ×4 IMPLANT
IV NS IRRIG 3000ML ARTHROMATIC (IV SOLUTION) ×4 IMPLANT
KIT TURNOVER CYSTO (KITS) ×2 IMPLANT
MANIFOLD NEPTUNE II (INSTRUMENTS) ×2 IMPLANT
NS IRRIG 500ML POUR BTL (IV SOLUTION) ×2 IMPLANT
PACK ARTHROSCOPY DSU (CUSTOM PROCEDURE TRAY) ×2 IMPLANT
PACK ICE MAXI GEL EZY WRAP (MISCELLANEOUS) ×2 IMPLANT
SUT MNCRL AB 3-0 PS2 27 (SUTURE) ×2 IMPLANT
SYR CONTROL 10ML LL (SYRINGE) ×2 IMPLANT
TOWEL OR 17X26 10 PK STRL BLUE (TOWEL DISPOSABLE) ×2 IMPLANT
TUBE CONNECTING 12X1/4 (SUCTIONS) ×2 IMPLANT
TUBING ARTHROSCOPY IRRIG 16FT (MISCELLANEOUS) ×2 IMPLANT
TUBING CONNECTING 10 (TUBING) ×2 IMPLANT

## 2021-04-27 NOTE — H&P (Signed)
ORTHOPAEDIC H and P  REQUESTING PHYSICIAN: Nicholes Stairs, MD  PCP:  Shelda Pal, DO  Chief Complaint: Right knee pain  HPI: Jeremy Pierce is a 50 y.o. male who complains of recalcitrant right knee pain following conservative management of medial meniscus pathology.  He has been noted to have medial meniscus tear with medial tibia subchondral stress fracture.  Here today for arthroscopic intervention.  Past Medical History:  Diagnosis Date   Ear pain, left 03/2021   per pt was seen by pcp 04-14-2021 note in epic   ED (erectile dysfunction)    History of COVID-19 11/2020   per pt mild symptoms that resolved   Mixed hyperlipidemia    Right knee meniscal tear    Type 2 diabetes mellitus (HCC)    Wears glasses    Past Surgical History:  Procedure Laterality Date   COLONOSCOPY  03/01/2021   URETERAL BIOPSY Left 1982   benign growth   WRIST FRACTURE SURGERY Right 1987   closed reduction   Social History   Socioeconomic History   Marital status: Married    Spouse name: Not on file   Number of children: Not on file   Years of education: Not on file   Highest education level: Not on file  Occupational History   Not on file  Tobacco Use   Smoking status: Former    Years: 3.00    Types: Cigarettes    Quit date: 1999    Years since quitting: 23.7   Smokeless tobacco: Never  Vaping Use   Vaping Use: Never used  Substance and Sexual Activity   Alcohol use: Yes    Comment: seldom   Drug use: Never   Sexual activity: Yes    Partners: Female    Birth control/protection: Surgical    Comment: vasectomy  Other Topics Concern   Not on file  Social History Narrative   Not on file   Social Determinants of Health   Financial Resource Strain: Not on file  Food Insecurity: Not on file  Transportation Needs: Not on file  Physical Activity: Not on file  Stress: Not on file  Social Connections: Not on file   Family History  Problem Relation Age of  Onset   Cancer Mother        breast cancer and tongue cancer   Diabetes Mother    Kidney disease Father    Heart disease Father    Cancer Sister        breast   Diabetes Maternal Grandfather    Colon polyps Maternal Grandfather    Colon cancer Neg Hx    Esophageal cancer Neg Hx    Rectal cancer Neg Hx    Stomach cancer Neg Hx    No Known Allergies Prior to Admission medications   Medication Sig Start Date End Date Taking? Authorizing Provider  Cyanocobalamin (VITAMIN B 12 PO) Take by mouth daily.   Yes [provider]  fenofibrate (TRICOR) 48 MG tablet Take 1 tablet (48 mg total) by mouth daily. Patient taking differently: Take 48 mg by mouth at bedtime. 04/17/21  Yes Shelda Pal, DO  fluticasone (FLONASE) 50 MCG/ACT nasal spray Place 2 sprays into both nostrils daily. Patient taking differently: Place 2 sprays into both nostrils 2 (two) times daily. 04/14/21  Yes Shelda Pal, DO  metFORMIN (GLUCOPHAGE) 500 MG tablet TAKE 2 TABLETS(1000 MG) BY MOUTH TWICE DAILY WITH A MEAL Patient taking differently: Take 500 mg by mouth 2 (  two) times daily with a meal. 02/20/21  Yes Wendling, Crosby Oyster, DO  simvastatin (ZOCOR) 20 MG tablet TAKE 1 TABLET(20 MG) BY MOUTH AT BEDTIME Patient taking differently: Take 20 mg by mouth at bedtime. TAKE 1 TABLET(20 MG) BY MOUTH AT BEDTIME 01/31/21  Yes Shelda Pal, DO  VITAMIN D PO Take by mouth daily.   Yes [provider]  Multiple Vitamins-Minerals (MULTIVITAMIN WITH MINERALS) tablet Take 1 tablet by mouth daily. Patient not taking: Reported on 04/20/2021    [provider]   No results found.  Positive ROS: All other systems have been reviewed and were otherwise negative with the exception of those mentioned in the HPI and as above.  Physical Exam: General: Alert, no acute distress Cardiovascular: No pedal edema Respiratory: No cyanosis, no use of accessory musculature GI: No organomegaly,  abdomen is soft and non-tender Skin: No lesions in the area of chief complaint Neurologic: Sensation intact distally Psychiatric: Patient is competent for consent with normal mood and affect Lymphatic: No axillary or cervical lymphadenopathy  MUSCULOSKELETAL:  Right knee:  No open wounds.  Mild effusion.  Otherwise neurovascular intact.  Assessment: 1.  Right knee medial meniscus tear, acute.  2.  Right knee medial tibial stress fracture  Plan: -Plan to proceed today with just the right knee arthroscopy with partial medial meniscectomy.  We had tried to get the subchondral plasty approved for the adjacent pathology.  Unfortunately, [employer] denied that procedure based on my conversation with a pediatrician in [place] state.  We will proceed with just the approved procedure on the right knee with the partial medial meniscectomy.  We again discussed the risk of bleeding, infection, DVT, stiffness, failure of pain relief, need for further surgery, as well as the risk of anesthesia and he has provided informed consent.  -Plan for discharge home postoperatively from PACU.    Nicholes Stairs, MD Cell [phone]    04/27/2021 12:28 PM

## 2021-04-27 NOTE — Discharge Instructions (Addendum)
Post-operative patient instructions  °Knee Arthroscopy  ° °Ice:  Place intermittent ice or cooler pack over your knee, 30 minutes on and 30 minutes off.  Continue this for the first 72 hours after surgery, then save ice for use after therapy sessions or on more active days.   °Weight:  You may bear weight on your leg as your symptoms allow. °DVT prevention: Perform ankle pumps as able throughout the day on the operative extremity.  Be mobile as possible with ambulation as able.  You should also take an 81 mg aspirin once per day x6 weeks. °Crutches:  Use crutches (or walker) to assist in walking until told to discontinue by your physical therapist or physician. This will help to reduce pain. °Strengthening:  Perform simple thigh squeezes (isometric quad contractions) and straight leg lifts as you are able (3 sets of 5 to 10 repetitions, 3 times a day).  For the leg lifts, have someone support under your ankle in the beginning until you have increased strength enough to do this on your own.  To help get started on thigh squeezes, place a pillow under your knee and push down on the pillow with back of knee (sometimes easier to do than with your leg fully straight). °Motion:  Perform gentle knee motion as tolerated - this is gentle bending and straightening of the knee. Seated heel slides: you can start by sitting in a chair, remove your brace, and gently slide your heel back on the floor - allowing your knee to bend. Have someone help you straighten your knee (or use your other leg/foot hooked under your ankle.  °Dressing:  Perform 1st dressing change at 3 days postoperative. A moderate amount of blood tinged drainage is to be expected.  So if you bleed through the dressing on the first or second day or if you have fevers, it is fine to change the dressing/check the wounds early and redress wound. Elevate your leg.  If it bleeds through again, or if the incisions are leaking frank blood, please call the office. May  change dressing every 1-2 days thereafter to help watch wounds. Can purchase Tegderm (or 3M Nexcare) water resistant dressings at local pharmacy / [facility]. °Shower:  Light shower is ok after 3 days.  Please take shower, NO bath. Recover with gauze and ace wrap to help keep wounds protected.   °Pain medication:  A narcotic pain medication has been prescribed.  Take as directed.  Typically you need narcotic pain medication more regularly during the first 3 to 5 days after surgery.  Decrease your use of the medication as the pain improves.  Narcotics can sometimes cause constipation, even after a few doses.  If you have problems with constipation, you can take an over the counter stool softener or light laxative.  If you have persistent problems, please notify your physician’s office. °Physical therapy: Additional activity guidelines to be provided by your physician or physical therapist at follow-up visits.  °Driving: Do not recommend driving x 1-2 weeks post surgical, especially if surgery performed on right side. Should not drive while taking narcotic pain medications. It typically takes at least 2 weeks to restore sufficient neuromuscular function for normal reaction times for driving safety.  °Call [phone] for questions or problems. Evenings you will be forwarded to the hospital operator.  Ask for the orthopaedic physician on call. Please call if you experience:  °  °Redness, foul smelling, or persistent drainage from the surgical site  °worsening knee pain and   swelling not responsive to medication  any calf pain and or swelling of the lower leg  temperatures greater than 101.5 F other questions or concerns   Thank you for allowing [place] to be a part of your care   Post Anesthesia Home Care Instructions  Activity: Get plenty of rest for the remainder of the day. A responsible individual must stay with you for 24 hours following the procedure.  For the next 24 hours, DO NOT: -Drive a car -Conservation officer, nature -Drink alcoholic beverages -Take any medication unless instructed by your physician -Make any legal decisions or sign important papers.  Meals: Start with liquid foods such as gelatin or soup. Progress to regular foods as tolerated. Avoid greasy, spicy, heavy foods. If nausea and/or vomiting occur, drink only clear liquids until the nausea and/or vomiting subsides. Call your physician if vomiting continues.  Special Instructions/Symptoms: Your throat may feel dry or sore from the anesthesia or the breathing tube placed in your throat during surgery. If this causes discomfort, gargle with warm salt water. The discomfort should disappear within 24 hours.

## 2021-04-27 NOTE — Transfer of Care (Signed)
Immediate Anesthesia Transfer of Care Note  Patient: Jeremy Pierce  Procedure(s) Performed: Procedure(s) (LRB): KNEE ARTHROSCOPY WITH PARTIAL MEDIAL MENISECTOMY (Right)  Patient Location: PACU  Anesthesia Type: General  Level of Consciousness: awake, sedated, patient cooperative and responds to stimulation  Airway & Oxygen Therapy: Patient Spontanous Breathing and Patient connected to nasal oxygen with soft FM   Post-op Assessment: Report given to PACU RN, Post -op Vital signs reviewed and stable and Patient moving all extremities  Post vital signs: Reviewed and stable  Complications: No apparent anesthesia complications

## 2021-04-27 NOTE — Brief Op Note (Signed)
04/27/2021  1:21 PM  PATIENT:  Jeremy Pierce  50 y.o. male  PRE-OPERATIVE DIAGNOSIS:  Right knee medial tibia stress fracture, medial meniscus tear  POST-OPERATIVE DIAGNOSIS:  Right knee medial tibia stress fracture, medial meniscus tear  PROCEDURE:  Procedure(s): KNEE ARTHROSCOPY WITH PARTIAL MEDIAL MENISECTOMY (Right)  SURGEON:  Surgeon(s) and Role:    Nicholes Stairs, MD - Primary   ASSISTANTS: none   ANESTHESIA:   local and general  EBL:  5 mL   BLOOD ADMINISTERED:none  DRAINS: none   LOCAL MEDICATIONS USED:  MARCAINE     SPECIMEN:  No Specimen  DISPOSITION OF SPECIMEN:  N/A  COUNTS:  YES  TOURNIQUET:  * Missing tourniquet times found for documented tourniquets in log: [id number] *  DICTATION: .Note written in EPIC  PLAN OF CARE: Discharge to home after PACU  PATIENT DISPOSITION:  PACU - hemodynamically stable.   Delay start of Pharmacological VTE agent (>24hrs) due to surgical blood loss or risk of bleeding: not applicable

## 2021-04-27 NOTE — Anesthesia Preprocedure Evaluation (Addendum)
Anesthesia Evaluation  Patient identified by MRN, date of birth, ID band Patient awake    Reviewed: Allergy & Precautions, NPO status , Patient's Chart, lab work & pertinent test results  History of Anesthesia Complications (+) AWARENESS UNDER ANESTHESIA  Airway Mallampati: II  TM Distance: >3 FB Neck ROM: Full    Dental no notable dental hx.    Pulmonary former smoker,    Pulmonary exam normal breath sounds clear to auscultation       Cardiovascular Exercise Tolerance: Good negative cardio ROS Normal cardiovascular exam Rhythm:Regular Rate:Normal     Neuro/Psych negative neurological ROS  negative psych ROS   GI/Hepatic negative GI ROS, Neg liver ROS,   Endo/Other  diabetes  Renal/GU negative Renal ROS  negative genitourinary   Musculoskeletal negative musculoskeletal ROS (+)   Abdominal   Peds negative pediatric ROS (+)  Hematology negative hematology ROS (+)   Anesthesia Other Findings   Reproductive/Obstetrics negative OB ROS                            Anesthesia Physical Anesthesia Plan  ASA: 2  Anesthesia Plan: General   Post-op Pain Management:    Induction: Intravenous  PONV Risk Score and Plan: 2 and Treatment may vary due to age or medical condition, Ondansetron, Dexamethasone and Midazolam  Airway Management Planned: LMA  Additional Equipment: None  Intra-op Plan:   Post-operative Plan: Extubation in OR  Informed Consent: I have reviewed the patients History and Physical, chart, labs and discussed the procedure including the risks, benefits and alternatives for the proposed anesthesia with the patient or authorized representative who has indicated his/her understanding and acceptance.     Dental advisory given  Plan Discussed with: CRNA and Anesthesiologist  Anesthesia Plan Comments: (GA/LMA. Norton Blizzard, MD  )       Anesthesia Quick Evaluation

## 2021-04-27 NOTE — Anesthesia Procedure Notes (Signed)
Procedure Name: LMA Insertion Date/Time: 04/27/2021 12:43 PM Performed by: Justice Rocher, CRNA Pre-anesthesia Checklist: Patient identified, Emergency Drugs available, Suction available, Patient being monitored and Timeout performed Patient Re-evaluated:Patient Re-evaluated prior to induction Oxygen Delivery Method: Circle system utilized Preoxygenation: Pre-oxygenation with 100% oxygen Induction Type: IV induction Ventilation: Mask ventilation without difficulty LMA: LMA inserted LMA Size: 5.0 Number of attempts: 1 Airway Equipment and Method: Bite block Placement Confirmation: positive ETCO2, breath sounds checked- equal and bilateral and CO2 detector Tube secured with: Tape Dental Injury: Teeth and Oropharynx as per pre-operative assessment

## 2021-04-27 NOTE — Anesthesia Postprocedure Evaluation (Signed)
Anesthesia Post Note  Patient: Jeremy Pierce  Procedure(s) Performed: KNEE ARTHROSCOPY WITH PARTIAL MEDIAL MENISECTOMY (Right: Knee)     Patient location during evaluation: PACU Anesthesia Type: General Level of consciousness: awake Pain management: pain level controlled Vital Signs Assessment: post-procedure vital signs reviewed and stable Respiratory status: spontaneous breathing and respiratory function stable Cardiovascular status: stable Postop Assessment: no apparent nausea or vomiting Anesthetic complications: no   No notable events documented.  Last Vitals:  Vitals:   04/27/21 1345 04/27/21 1400  BP: 116/80 113/75  Pulse: 74 70  Resp: 17 15  Temp:  36.7 C  SpO2: 97% 97%    Last Pain:  Vitals:   04/27/21 1400  TempSrc:   PainSc: [place]

## 2021-04-27 NOTE — Op Note (Signed)
Surgery Date: 04/27/2021  Surgeon(s): Nicholes Stairs, MD  ANESTHESIA:  general  FLUIDS: Per anesthesia record.   ESTIMATED BLOOD LOSS: minimal  PREOPERATIVE DIAGNOSES:  1. Right knee medial meniscus tear 2.  Right knee synovitis  POSTOPERATIVE DIAGNOSES:  same  PROCEDURES PERFORMED:  1. knee arthroscopy with arthroscopic partial medial meniscectomy 2. knee arthroscopy with arthroscopic chondroplasty medial femoral condyle and medial tibial plateau.   DESCRIPTION OF PROCEDURE: Jeremy Pierce is a 50 y.o.-year-old male with right knee medial meniscus tear. Plans are to proceed with partial medial meniscectomy and diagnostic arthroscopy with debridement as indicated. Full discussion held regarding risks benefits alternatives and complications related surgical intervention. Conservative care options reviewed. All questions answered.  The patient was identified in the preoperative holding area and the operative extremity was marked. The patient was brought to the operating room and transferred to operating table in a supine position. Satisfactory general anesthesia was induced by anesthesiology.    Standard anterolateral, anteromedial arthroscopy portals were obtained. The anteromedial portal was obtained with a spinal needle for localization under direct visualization with subsequent diagnostic findings.   Anteromedial and anterolateral chambers: mild synovitis. The synovitis was debrided with a 4.5 mm full radius shaver through both the anteromedial and lateral portals.   Suprapatellar pouch and gutters: mild synovitis or debris. Patella chondral surface: Grade 0 Trochlear chondral surface: Grade 1 Patellofemoral tracking: midline and level Medial meniscus: complex tear of the mid body with parrot beak tear into the medial gutter with horizontal posterior extension.  Medial femoral condyle flexion bearing surface: Grade 3 Medial femoral condyle extension bearing surface: Grade  2 Medial tibial plateau: Grade 1 Anterior cruciate ligament:stable Posterior cruciate ligament:stable Lateral meniscus: intact.   Lateral femoral condyle flexion bearing surface: Grade 0 Lateral femoral condyle extension bearing surface: Grade 0 Lateral tibial plateau: Grade 0  Partial medial meniscectomy was carried out with combination of motorized shaver as well as meniscal basket.  Unstable flap tear of the mid body was completely resected back to stable edge.  Inferior flap of the horizontal component was resected likewise in a similar fashion.  After completion of synovectomy, diagnostic exam, and debridements as described, all compartments were checked and no residual debris remained. Hemostasis was achieved with the cautery wand. The portals were approximated with buried monocryl. All excess fluid was expressed from the joint.  Xeroform sterile gauze dressings were applied followed by Ace bandage and ice pack.   DISPOSITION: The patient was awakened from general anesthetic, extubated, taken to the recovery room in medically stable condition, no apparent complications. The patient may be weightbearing as tolerated to the operative lower extremity.  Range of motion of right knee as tolerated.

## 2021-04-28 ENCOUNTER — Encounter (HOSPITAL_BASED_OUTPATIENT_CLINIC_OR_DEPARTMENT_OTHER): Payer: Self-pay | Admitting: Orthopedic Surgery

## 2021-05-04 DIAGNOSIS — M25561 Pain in right knee: Secondary | ICD-10-CM | POA: Diagnosis not present

## 2021-05-09 DIAGNOSIS — M25561 Pain in right knee: Secondary | ICD-10-CM | POA: Diagnosis not present

## 2021-05-11 DIAGNOSIS — M25561 Pain in right knee: Secondary | ICD-10-CM | POA: Diagnosis not present

## 2021-05-16 DIAGNOSIS — M25561 Pain in right knee: Secondary | ICD-10-CM | POA: Diagnosis not present

## 2021-05-22 DIAGNOSIS — M25561 Pain in right knee: Secondary | ICD-10-CM | POA: Diagnosis not present

## 2021-05-25 DIAGNOSIS — M25561 Pain in right knee: Secondary | ICD-10-CM | POA: Diagnosis not present

## 2021-05-29 DIAGNOSIS — M25561 Pain in right knee: Secondary | ICD-10-CM | POA: Diagnosis not present

## 2021-06-01 DIAGNOSIS — M25561 Pain in right knee: Secondary | ICD-10-CM | POA: Diagnosis not present

## 2021-06-06 DIAGNOSIS — M25561 Pain in right knee: Secondary | ICD-10-CM | POA: Diagnosis not present

## 2021-06-14 ENCOUNTER — Ambulatory Visit
Admission: EM | Admit: 2021-06-14 | Discharge: 2021-06-14 | Disposition: A | Discharge status: Home / Self Care | Payer: BC Managed Care – PPO

## 2021-06-14 ENCOUNTER — Emergency Department (HOSPITAL_BASED_OUTPATIENT_CLINIC_OR_DEPARTMENT_OTHER)
Admission: EM | Admit: 2021-06-14 | Discharge: 2021-06-14 | Disposition: A | Discharge status: Home / Self Care | Payer: BC Managed Care – PPO | Attending: Emergency Medicine | Admitting: Emergency Medicine

## 2021-06-14 ENCOUNTER — Other Ambulatory Visit: Payer: Self-pay

## 2021-06-14 ENCOUNTER — Encounter (HOSPITAL_BASED_OUTPATIENT_CLINIC_OR_DEPARTMENT_OTHER): Payer: Self-pay

## 2021-06-14 DIAGNOSIS — Z7984 Long term (current) use of oral hypoglycemic drugs: Secondary | ICD-10-CM | POA: Diagnosis not present

## 2021-06-14 DIAGNOSIS — S61012A Laceration without foreign body of left thumb without damage to nail, initial encounter: Secondary | ICD-10-CM | POA: Diagnosis not present

## 2021-06-14 DIAGNOSIS — S61002A Unspecified open wound of left thumb without damage to nail, initial encounter: Secondary | ICD-10-CM

## 2021-06-14 DIAGNOSIS — Z23 Encounter for immunization: Secondary | ICD-10-CM | POA: Insufficient documentation

## 2021-06-14 DIAGNOSIS — Z8616 Personal history of COVID-19: Secondary | ICD-10-CM | POA: Insufficient documentation

## 2021-06-14 DIAGNOSIS — S6992XA Unspecified injury of left wrist, hand and finger(s), initial encounter: Secondary | ICD-10-CM | POA: Diagnosis not present

## 2021-06-14 DIAGNOSIS — S61022A Laceration with foreign body of left thumb without damage to nail, initial encounter: Secondary | ICD-10-CM | POA: Diagnosis not present

## 2021-06-14 DIAGNOSIS — E119 Type 2 diabetes mellitus without complications: Secondary | ICD-10-CM | POA: Insufficient documentation

## 2021-06-14 DIAGNOSIS — W260XXA Contact with knife, initial encounter: Secondary | ICD-10-CM | POA: Diagnosis not present

## 2021-06-14 DIAGNOSIS — Z96651 Presence of right artificial knee joint: Secondary | ICD-10-CM | POA: Insufficient documentation

## 2021-06-14 DIAGNOSIS — Z87891 Personal history of nicotine dependence: Secondary | ICD-10-CM | POA: Diagnosis not present

## 2021-06-14 MED ORDER — LIDOCAINE HCL (PF) 1 % IJ SOLN
5.0000 mL | Freq: Once | INTRAMUSCULAR | Status: AC
  Administered 2021-06-14: 5 mL
  Filled 2021-06-14: qty 5

## 2021-06-14 MED ORDER — TETANUS-DIPHTH-ACELL PERTUSSIS 5-2.5-18.5 LF-MCG/0.5 IM SUSY
0.5000 mL | PREFILLED_SYRINGE | Freq: Once | INTRAMUSCULAR | Status: AC
  Administered 2021-06-14: 0.5 mL via INTRAMUSCULAR
  Filled 2021-06-14: qty 0.5

## 2021-06-14 NOTE — ED Provider Notes (Signed)
[facility] Provider Note   CSN: [id number] Arrival date & time: 06/14/21  1156     History Chief Complaint  Patient presents with   Finger Injury    Jeremy Pierce is a 50 y.o. male.  HPI Patient is a 50 year old right-hand-dominant male with a medical history as noted below.  He presents to the emergency department due to a laceration to the left thumb that occurred around 9:30 AM this morning.  Patient states he was cutting zip ties with a pocket knife, it slipped, and cut the affected region.  Mild bleeding that has resolved with direct pressure.  Patient reports pain in the region.  No numbness.  He was initially seen in urgent care prior to arrival and there was concern of possible extensor tendon involvement and he was sent to the emergency department for further evaluation.  Patient is unsure of the timing of his last Tdap but states that it was greater than 5 years ago.    Past Medical History:  Diagnosis Date   Ear pain, left 03/2021   per pt was seen by pcp 04-14-2021 note in epic   ED (erectile dysfunction)    History of COVID-19 11/2020   per pt mild symptoms that resolved   Mixed hyperlipidemia    Right knee meniscal tear    Type 2 diabetes mellitus ([place])    Wears glasses    Patient Active Problem List   Diagnosis Date Noted   Prediabetes 07/07/2019   Type 2 diabetes mellitus with hyperglycemia, without long-term current use of insulin ([place]) 07/07/2019   Hyperlipidemia 07/02/2018    Past Surgical History:  Procedure Laterality Date   COLONOSCOPY  03/01/2021   KNEE ARTHROSCOPY WITH SUBCHONDROPLASTY Right 04/27/2021   Procedure: KNEE ARTHROSCOPY WITH PARTIAL MEDIAL MENISECTOMY;  Surgeon: Nicholes Stairs, MD;  Location: [facility];  Service: Orthopedics;  Laterality: Right;   URETERAL BIOPSY Left 1982   benign growth   WRIST FRACTURE SURGERY Right 1987   closed reduction       Family History  Problem  Relation Age of Onset   Cancer Mother        breast cancer and tongue cancer   Diabetes Mother    Kidney disease Father    Heart disease Father    Cancer Sister        breast   Diabetes Maternal Grandfather    Colon polyps Maternal Grandfather    Colon cancer Neg Hx    Esophageal cancer Neg Hx    Rectal cancer Neg Hx    Stomach cancer Neg Hx     Social History   Tobacco Use   Smoking status: Former    Years: 3.00    Types: Cigarettes    Quit date: 1999    Years since quitting: 23.8   Smokeless tobacco: Never  Vaping Use   Vaping Use: Never used  Substance Use Topics   Alcohol use: Yes    Comment: seldom   Drug use: Never    Home Medications Prior to Admission medications   Medication Sig Start Date End Date Taking? Authorizing Provider  Cyanocobalamin (VITAMIN B 12 PO) Take by mouth daily.    [provider]  fenofibrate (TRICOR) 48 MG tablet Take 1 tablet (48 mg total) by mouth daily. Patient taking differently: Take 48 mg by mouth at bedtime. 04/17/21   Shelda Pal, DO  fluticasone (FLONASE) 50 MCG/ACT nasal spray Place 2 sprays into both nostrils  daily. Patient taking differently: Place 2 sprays into both nostrils 2 (two) times daily. 04/14/21   Shelda Pal, DO  HYDROcodone-acetaminophen (NORCO/VICODIN) 5-325 MG tablet Take 1 tablet by mouth every 4 (four) hours as needed for moderate pain. 04/27/21 04/27/22  Nicholes Stairs, MD  metFORMIN (GLUCOPHAGE) 500 MG tablet TAKE 2 TABLETS(1000 MG) BY MOUTH TWICE DAILY WITH A MEAL Patient taking differently: Take 500 mg by mouth 2 (two) times daily with a meal. 02/20/21   Wendling, Crosby Oyster, DO  Multiple Vitamins-Minerals (MULTIVITAMIN WITH MINERALS) tablet Take 1 tablet by mouth daily. Patient not taking: Reported on 04/20/2021    [provider]  ondansetron (ZOFRAN) 4 MG tablet Take 1 tablet (4 mg total) by mouth every 8 (eight) hours as needed for nausea or vomiting. 04/27/21  04/27/22  Nicholes Stairs, MD  simvastatin (ZOCOR) 20 MG tablet TAKE 1 TABLET(20 MG) BY MOUTH AT BEDTIME Patient taking differently: Take 20 mg by mouth at bedtime. TAKE 1 TABLET(20 MG) BY MOUTH AT BEDTIME 01/31/21   Shelda Pal, DO  VITAMIN D PO Take by mouth daily.    [provider]    Allergies    Patient has no known allergies.  Review of Systems   Review of Systems  Musculoskeletal:  Positive for arthralgias and myalgias.  Skin:  Positive for wound.  Neurological:  Positive for weakness. Negative for numbness.   Physical Exam Updated Vital Signs BP 115/80 (BP Location: Right Arm)   Pulse 77   Temp 98.1 F (36.7 C) (Oral)   Resp 18   Ht 5\' 10"  (1.778 m)   Wt 88.5 kg   SpO2 98%   BMI 27.98 kg/m   Physical Exam Vitals and nursing note reviewed.  Constitutional:      General: He is not in acute distress.    Appearance: He is well-developed.  HENT:     Head: Normocephalic and atraumatic.     Right Ear: External ear normal.     Left Ear: External ear normal.  Eyes:     General: No scleral icterus.       Right eye: No discharge.        Left eye: No discharge.     Conjunctiva/sclera: Conjunctivae normal.  Neck:     Trachea: No tracheal deviation.  Cardiovascular:     Rate and Rhythm: Normal rate.  Pulmonary:     Effort: Pulmonary effort is normal. No respiratory distress.     Breath sounds: No stridor.  Abdominal:     General: There is no distension.  Musculoskeletal:        General: Tenderness present. No swelling or deformity.     Cervical back: Neck supple.  Skin:    General: Skin is warm and dry.     Findings: No rash.     Comments: 1.5 cm laceration to the dorsum of the proximal phalanx of the left thumb.  Wound is linear and well approximated.  No active bleeding.  Distal sensation intact.  Good cap refill.  Full range of motion of the thumb.  Mild weakness noted with resisted extension of the left thumb when compared to the right.   Neurological:     Mental Status: He is alert.     Cranial Nerves: Cranial nerve deficit: no gross deficits.    ED Results / Procedures / Treatments   Labs (all labs ordered are listed, but only abnormal results are displayed) Labs Reviewed - No data to display  EKG None  Radiology No results found.  Procedures .Marland KitchenLaceration Repair  Date/Time: 06/14/2021 1:36 PM Performed by: Rayna Sexton, PA-C Authorized by: Rayna Sexton, PA-C   Consent:    Consent obtained:  Verbal   Consent given by:  Patient   Risks discussed:  Infection, need for additional repair, pain, poor cosmetic result and poor wound healing   Alternatives discussed:  No treatment and delayed treatment Universal protocol:    Procedure explained and questions answered to patient or proxy's satisfaction: yes     Relevant documents present and verified: yes     Test results available: yes     Imaging studies available: yes     Required blood products, implants, devices, and special equipment available: yes     Site/side marked: yes     Immediately prior to procedure, a time out was called: yes     Patient identity confirmed:  Verbally with patient Anesthesia:    Anesthesia method:  Local infiltration   Local anesthetic:  Lidocaine 1% w/o epi Laceration details:    Length (cm):  1.5 Pre-procedure details:    Preparation:  Patient was prepped and draped in usual sterile fashion Exploration:    Hemostasis achieved with:  Direct pressure   Wound exploration: wound explored through full range of motion     Wound extent: no foreign bodies/material noted     Contaminated: no   Treatment:    Area cleansed with:  Povidone-iodine, saline and Shur-Clens   Amount of cleaning:  Extensive   Irrigation method:  Pressure wash Skin repair:    Repair method:  Sutures   Suture size:  4-0   Suture material:  Prolene   Suture technique:  Simple interrupted   Number of sutures:  3 Approximation:    Approximation:   Close Repair type:    Repair type:  Simple Post-procedure details:    Dressing:  Splint for protection   Procedure completion:  Tolerated well, no immediate complications   Medications Ordered in ED Medications  lidocaine (PF) (XYLOCAINE) 1 % injection 5 mL (5 mLs Infiltration Given 06/14/21 1308)  Tdap (BOOSTRIX) injection 0.5 mL (0.5 mLs Intramuscular Given 06/14/21 1309)   ED Course  I have reviewed the triage vital signs and the nursing notes.  Pertinent labs & imaging results that were available during my care of the patient were reviewed by me and considered in my medical decision making (see chart for details).    MDM Rules/Calculators/A&P                          Pt is a 50 y.o. male who presents to the emergency department due to a laceration to the left thumb that occurred this morning.  Physical exam significant for a 1.5 cm laceration along the dorsum of the left thumb.  Wound is well approximated.  Unable to visualize tendon involvement.  Patient did have a mild decrease in strength with resisted extension of the thumb.  Wound was cleaned extensively and closed with Prolene sutures.  Please see the procedure note above.  Patient given a referral to hand surgery and recommended that he schedule an appointment as soon as possible for reevaluation of his thumb.  Recommended suture removal in 7 to 10 days.  Patient placed in a splint.  Feel the patient is stable for discharge at this time and he is agreeable.  Tdap updated in the ED.  Discussed return precautions.  His questions were  answered and he was amicable at the time of discharge.  Note: Portions of this report may have been transcribed using voice recognition software. Every effort was made to ensure accuracy; however, inadvertent computerized transcription errors may be present.   Final Clinical Impression(s) / ED Diagnoses Final diagnoses:  Laceration of left thumb without damage to nail, foreign body presence  unspecified, initial encounter   Rx / DC Orders ED Discharge Orders     None        Rayna Sexton, PA-C 06/14/21 1340    Regan Lemming, MD 06/14/21 1453

## 2021-06-14 NOTE — Discharge Instructions (Addendum)
Please apply bacitracin or triple antibiotic to your wound 1-2 times per day.  Please have your stitches removed in 7 to 10 days.  Below is the contact information for [facility].  Please give them a call and schedule an appointment for reevaluation.  There is concerned that you could possibly have extensor tendon involvement on the cut of your left thumb.  This will need to be evaluated by a hand surgeon as well.  If you develop any new or worsening symptoms please come back to the emergency department.

## 2021-06-14 NOTE — ED Notes (Signed)
Patient is being discharged from the Urgent Care and sent to the Emergency Department via [place]. Per L. Blanchie Serve, patient is in need of higher level of care due to Laceration degree. Patient is aware and verbalizes understanding of plan of care.  Vitals:   06/14/21 1105  BP: 116/76  Pulse: 73  Resp: 18  Temp: 98.1 F (36.7 C)  SpO2: 95%

## 2021-06-14 NOTE — ED Provider Notes (Signed)
  [facility]    CSN: [id number] Arrival date & time: 06/14/21  1009   History   Chief Complaint Chief Complaint  Patient presents with   Laceration   HPI Jeremy Pierce is a 50 y.o. male. Patient complains of thumb laceration on his posterior left thumb between his proximal and distal joints.  Per my observation, wound is deep to fascia and tendon is exposed, patient has significant pain with extension of the distal joint in his thumb.  Wound is currently hemostatic, 1.5 cm in length, 3 mm deep at least.  Patient was advised that he needs to be seen emergently for evaluation of the tendon to make sure it is not damaged and for possible 2 layer closure.  Patient verbalized understanding and agreed to go to the emergency room now.  Wound was covered with bacitracin ointment and Telfa pad secured with Covan.  The history is provided by the patient.     Lynden Oxford Scales, PA-C 06/14/21 1146

## 2021-06-14 NOTE — ED Triage Notes (Signed)
Pt reports he cut left thumb with a pocket knife ~930am-was seen/sent from [facility]-dsg intact-NAD-steady gait

## 2021-06-14 NOTE — ED Triage Notes (Signed)
Pt states he obtained a laceration today on his left thumb.  The laceration is to pts lower thumb,there is a small amount of active bleeding.

## 2021-06-29 DIAGNOSIS — S61012A Laceration without foreign body of left thumb without damage to nail, initial encounter: Secondary | ICD-10-CM | POA: Diagnosis not present

## 2021-09-08 ENCOUNTER — Other Ambulatory Visit: Payer: Self-pay | Admitting: Family Medicine

## 2021-09-08 MED ORDER — METFORMIN HCL 500 MG PO TABS: 1000.0000 mg | ORAL_TABLET | Freq: Two times a day (BID) | ORAL | 2 refills | Status: DC

## 2021-09-08 MED ORDER — SIMVASTATIN 20 MG PO TABS: 20.0000 mg | ORAL_TABLET | Freq: Every day | ORAL | 1 refills | Status: DC

## 2021-09-08 MED ORDER — METFORMIN HCL 500 MG PO TABS: 500.0000 mg | ORAL_TABLET | Freq: Two times a day (BID) | ORAL | 2 refills | Status: DC

## 2021-09-08 MED ORDER — FENOFIBRATE 48 MG PO TABS: 48.0000 mg | ORAL_TABLET | Freq: Every day | ORAL | 1 refills | Status: DC

## 2021-09-08 NOTE — Addendum Note (Signed)
Addended by: Sharon Seller B on: 09/08/2021 11:37 AM   Modules accepted: Orders

## 2021-09-08 NOTE — Addendum Note (Signed)
Addended by: Sharon Seller B on: 09/08/2021 11:40 AM   Modules accepted: Orders

## 2021-10-09 DIAGNOSIS — H9312 Tinnitus, left ear: Secondary | ICD-10-CM | POA: Diagnosis not present

## 2021-10-09 DIAGNOSIS — H9202 Otalgia, left ear: Secondary | ICD-10-CM | POA: Diagnosis not present

## 2021-10-09 DIAGNOSIS — H903 Sensorineural hearing loss, bilateral: Secondary | ICD-10-CM | POA: Diagnosis not present

## 2021-10-13 ENCOUNTER — Ambulatory Visit (INDEPENDENT_AMBULATORY_CARE_PROVIDER_SITE_OTHER): Payer: BC Managed Care – PPO | Admitting: Family Medicine

## 2021-10-13 ENCOUNTER — Encounter: Payer: Self-pay | Admitting: Family Medicine

## 2021-10-13 VITALS — BP 118/72 | HR 77 | Temp 97.9°F | Ht 70.5 in | Wt 186.2 lb

## 2021-10-13 DIAGNOSIS — E785 Hyperlipidemia, unspecified: Secondary | ICD-10-CM

## 2021-10-13 DIAGNOSIS — Z8349 Family history of other endocrine, nutritional and metabolic diseases: Secondary | ICD-10-CM | POA: Diagnosis not present

## 2021-10-13 DIAGNOSIS — E1165 Type 2 diabetes mellitus with hyperglycemia: Secondary | ICD-10-CM | POA: Diagnosis not present

## 2021-10-13 LAB — TSH: TSH: 1.69 u[IU]/mL (ref 0.35–5.50)

## 2021-10-13 LAB — LIPID PANEL
Cholesterol: 125 mg/dL (ref 0–200)
HDL: 36.6 mg/dL — ABNORMAL LOW (ref >39.00)
LDL Cholesterol: 64 mg/dL (ref 0–99)
NonHDL: 88.01
Total CHOL/HDL Ratio: 3
Triglycerides: 118 mg/dL (ref 0.0–149.0)
VLDL: 23.6 mg/dL (ref 0.0–40.0)

## 2021-10-13 LAB — COMPREHENSIVE METABOLIC PANEL
ALT: 26 U/L (ref 0–53)
AST: 20 U/L (ref 0–37)
Albumin: 5 g/dL (ref 3.5–5.2)
Alkaline Phosphatase: 41 U/L (ref 39–117)
BUN: 16 mg/dL (ref 6–23)
CO2: 26 mEq/L (ref 19–32)
Calcium: 10 mg/dL (ref 8.4–10.5)
Chloride: 106 mEq/L (ref 96–112)
Creatinine, Ser: 1.37 mg/dL (ref 0.40–1.50)
GFR: 60.29 mL/min (ref >60.00)
Glucose, Bld: 123 mg/dL — ABNORMAL HIGH (ref 70–99)
Potassium: 4.4 mEq/L (ref 3.5–5.1)
Sodium: 141 mEq/L (ref 135–145)
Total Bilirubin: 0.4 mg/dL (ref 0.2–1.2)
Total Protein: 7 g/dL (ref 6.0–8.3)

## 2021-10-13 LAB — MICROALBUMIN / CREATININE URINE RATIO
Creatinine,U: 160 mg/dL
Microalb Creat Ratio: 0.5 mg/g (ref 0.0–30.0)
Microalb, Ur: 0.7 mg/dL (ref 0.0–1.9)

## 2021-10-13 LAB — HEMOGLOBIN A1C: Hgb A1c MFr Bld: 6.6 % — ABNORMAL HIGH (ref 4.6–6.5)

## 2021-10-13 NOTE — Progress Notes (Signed)
Subjective:  ? ?Chief Complaint  ?Patient presents with  ? Follow-up  ?  6 month  ? ? ?Jeremy Pierce is a 51 y.o. male here for follow-up of diabetes.   ?Jeremy Pierce does not monitor his sugars.  ?Patient does not require insulin.   ?Medications include: metformin 1000 mg bid ?Diet is healthy.  ?Exercise: walking, cycling ?No Cp or SOB. ? ?Hyperlipidemia ?Patient presents for dyslipidemia follow up. ?Currently being treated with Zocor 20 mg/d, Tricor 48 mg/d and compliance with treatment thus far has been good. ?He denies myalgias. ?Diet/exercise as above.   ?The patient is not known to have coexisting coronary artery disease. ? ?Past Medical History:  ?Diagnosis Date  ? Ear pain, left 03/2021  ? per pt was seen by pcp 04-14-2021 note in epic  ? ED (erectile dysfunction)   ? History of COVID-19 11/2020  ? per pt mild symptoms that resolved  ? Mixed hyperlipidemia   ? Right knee meniscal tear   ? Type 2 diabetes mellitus ([place])   ? Wears glasses   ?  ? ?Related testing: ?Retinal exam: Due ?Pneumovax: done ? ?Objective:  ?BP 118/72   Pulse 77   Temp 97.9 ?F (36.6 ?C) (Oral)   Ht 5' 10.5" (1.791 m)   Wt 186 lb 4 oz (84.5 kg)   SpO2 99%   BMI 26.35 kg/m?  ?General:  Well developed, well nourished, in no apparent distress ?Skin:  Warm, no pallor or diaphoresis ?Head:  Normocephalic, atraumatic ?Eyes:  Pupils equal and round, sclera anicteric without injection  ?Lungs:  CTAB, no access msc use ?Cardio:  RRR, no bruits, no LE edema ?Psych: Age appropriate judgment and insight ? ?Assessment:  ? ?Type 2 diabetes mellitus with hyperglycemia, without long-term current use of insulin ([place]) ? ?Hyperlipidemia, unspecified hyperlipidemia type  ? ?Plan:  ? ?Chronic, stable. Cont metformin 1000 mg bid. Counseled on diet and exercise. Needs to sched eye exam, he has been forgetting to do this.  ?Chronic, unstable. Cont Zocor 20 mg/d, Tricor 48 mg/d. Ck labs today.  ?Shingrix rec'd. ?F/u in 6 mo. ?The patient voiced understanding and  agreement to the plan. ? ?Shelda Pal, DO ?10/13/21 ?8:22 AM ? ?

## 2021-10-13 NOTE — Patient Instructions (Signed)
Give us 2-3 business days to get the results of your labs back.   Keep the diet clean and stay active.  Let us know if you need anything. 

## 2021-10-25 ENCOUNTER — Other Ambulatory Visit: Payer: Self-pay

## 2021-10-25 ENCOUNTER — Ambulatory Visit
Admission: EM | Admit: 2021-10-25 | Discharge: 2021-10-25 | Disposition: A | Discharge status: Home / Self Care | Payer: BC Managed Care – PPO | Attending: Emergency Medicine | Admitting: Emergency Medicine

## 2021-10-25 DIAGNOSIS — H6992 Unspecified Eustachian tube disorder, left ear: Secondary | ICD-10-CM

## 2021-10-25 DIAGNOSIS — H6982 Other specified disorders of Eustachian tube, left ear: Secondary | ICD-10-CM

## 2021-10-25 DIAGNOSIS — S00431A Contusion of right ear, initial encounter: Secondary | ICD-10-CM | POA: Diagnosis not present

## 2021-10-25 DIAGNOSIS — H9312 Tinnitus, left ear: Secondary | ICD-10-CM

## 2021-10-25 DIAGNOSIS — H905 Unspecified sensorineural hearing loss: Secondary | ICD-10-CM

## 2021-10-25 MED ORDER — CIPROFLOXACIN-DEXAMETHASONE 0.3-0.1 % OT SUSP: 4.0000 [drp] | Freq: Two times a day (BID) | OTIC | 0 refills | Status: DC

## 2021-10-25 MED ORDER — METHYLPREDNISOLONE 4 MG PO TBPK: ORAL_TABLET | ORAL | 0 refills | Status: DC

## 2021-10-25 NOTE — ED Provider Notes (Signed)
?[facility] ? ? ? ?CSN: [id number] ?Arrival date & time: 10/25/21  7782 ?  ? ?HISTORY  ? ?Chief Complaint  ?Patient presents with  ? Ear Drainage  ? ?HPI ?Jeremy Pierce is a 51 y.o. male. Pt states last night he noticed blood drainage coming from right ear. Pt denies having any pain, changes in hearing and no changes to balance.  Patient states he is never had this happen before.  Patient denies history of allergies and eczema.  Patient endorses a history of sensorineural hearing loss in his left ear, actually has an appointment with his ear nose and throat specialist early next month for hearing screening which has to be done prior to his insurance approving CT scan of his sinuses and head.  Patient states that he has tinnitus in his left ear along with some sensation of fullness and numbness across his upper cheek below his eyes on the left as well.  Patient states this has been ongoing since 2021, feels that having had COVID that you are seem to have triggered these recurring symptoms.  Patient states has been prescribed prednisone in the past to help open up his left ear and allow his left sinuses to drain better, states it made him feel really jumpy though. ? ?The history is provided by the patient.  ?Past Medical History:  ?Diagnosis Date  ? Ear pain, left 03/2021  ? per pt was seen by pcp 04-14-2021 note in epic  ? ED (erectile dysfunction)   ? History of COVID-19 11/2020  ? per pt mild symptoms that resolved  ? Mixed hyperlipidemia   ? Right knee meniscal tear   ? Type 2 diabetes mellitus ([place])   ? Wears glasses   ? ?Patient Active Problem List  ? Diagnosis Date Noted  ? Prediabetes 07/07/2019  ? Type 2 diabetes mellitus with hyperglycemia, without long-term current use of insulin ([place]) 07/07/2019  ? Hyperlipidemia 07/02/2018  ? ?Past Surgical History:  ?Procedure Laterality Date  ? COLONOSCOPY  03/01/2021  ? KNEE ARTHROSCOPY WITH SUBCHONDROPLASTY Right 04/27/2021  ? Procedure: KNEE ARTHROSCOPY  WITH PARTIAL MEDIAL MENISECTOMY;  Surgeon: Nicholes Stairs, MD;  Location: [facility];  Service: Orthopedics;  Laterality: Right;  ? URETERAL BIOPSY Left 1982  ? benign growth  ? WRIST FRACTURE SURGERY Right 1987  ? closed reduction  ? ? ?Home Medications   ? ?Prior to Admission medications   ?Medication Sig Start Date End Date Taking? Authorizing Provider  ?Cyanocobalamin (VITAMIN B 12 PO) Take by mouth daily.    [provider]  ?fenofibrate (TRICOR) 48 MG tablet Take 1 tablet (48 mg total) by mouth daily. 09/08/21   Shelda Pal, DO  ?metFORMIN (GLUCOPHAGE) 500 MG tablet Take 2 tablets (1,000 mg total) by mouth 2 (two) times daily with a meal. 09/08/21   Wendling, Crosby Oyster, DO  ?Multiple Vitamins-Minerals (MULTIVITAMIN WITH MINERALS) tablet Take 1 tablet by mouth daily.    [provider]  ?simvastatin (ZOCOR) 20 MG tablet Take 1 tablet (20 mg total) by mouth at bedtime. TAKE 1 TABLET(20 MG) BY MOUTH AT BEDTIME 09/08/21   Shelda Pal, DO  ? ?Family History ?Family History  ?Problem Relation Age of Onset  ? Cancer Mother   ?     breast cancer and tongue cancer  ? Diabetes Mother   ? Kidney disease Father   ? Heart disease Father   ? Cancer Sister   ?     breast  ?  Diabetes Maternal Grandfather   ? Colon polyps Maternal Grandfather   ? Colon cancer Neg Hx   ? Esophageal cancer Neg Hx   ? Rectal cancer Neg Hx   ? Stomach cancer Neg Hx   ? ?Social History ?Social History  ? ?Tobacco Use  ? Smoking status: Former  ?  Years: 3.00  ?  Types: Cigarettes  ?  Quit date: 1999  ?  Years since quitting: 24.2  ? Smokeless tobacco: Never  ?Vaping Use  ? Vaping Use: Never used  ?Substance Use Topics  ? Alcohol use: Yes  ?  Comment: seldom  ? Drug use: Never  ? ?Allergies   ?Patient has no known allergies. ? ?Review of Systems ?Review of Systems ?Pertinent findings noted in history of present illness.  ? ?Physical Exam ?Triage Vital Signs ?ED Triage Vitals  ?Enc  Vitals Group  ?   BP 05/26/21 0827 (!) 147/82  ?   Pulse Rate 05/26/21 0827 72  ?   Resp 05/26/21 0827 18  ?   Temp 05/26/21 0827 98.3 ?F (36.8 ?C)  ?   Temp Source 05/26/21 0827 Oral  ?   SpO2 05/26/21 0827 98 %  ?   Weight --   ?   Height --   ?   Head Circumference --   ?   Peak Flow --   ?   Pain Score 05/26/21 0826 5  ?   Pain Loc --   ?   Pain Edu? --   ?   Excl. in [place]? --   ?No data found. ? ?Updated Vital Signs ?BP 105/71 (BP Location: Right Arm)   Pulse 69   Temp 98.1 ?F (36.7 ?C) (Oral)   Resp 20   SpO2 97%  ? ?Physical Exam ?Vitals and nursing note reviewed.  ?Constitutional:   ?   General: He is not in acute distress. ?   Appearance: Normal appearance. He is not ill-appearing.  ?HENT:  ?   Head: Normocephalic and atraumatic.  ?   Salivary Glands: Right salivary gland is not diffusely enlarged or tender. Left salivary gland is not diffusely enlarged or tender.  ?   Right Ear: Tympanic membrane and external ear normal. No decreased hearing noted. No drainage, swelling or tenderness. No middle ear effusion. There is no impacted cerumen. No foreign body. No mastoid tenderness. No hemotympanum. Tympanic membrane is not injected, scarred, perforated, erythematous, retracted or bulging.  ?   Left Ear: Tympanic membrane, ear canal and external ear normal. Decreased hearing noted. No drainage, swelling or tenderness.  No middle ear effusion. There is no impacted cerumen. No mastoid tenderness. No hemotympanum. Tympanic membrane is not injected, scarred, perforated, erythematous, retracted or bulging.  ?   Ears:  ?   Weber exam findings: Lateralizes right. ?   Right Rinne: AC > BC. ?   Left Rinne: BC > AC. ?   Comments: Bloody drainage and right ear canal without any signs of trauma or purulence. ?   Nose: Nose normal. No nasal deformity, septal deviation, mucosal edema, congestion or rhinorrhea.  ?   Right Turbinates: Not enlarged, swollen or pale.  ?   Left Turbinates: Not enlarged, swollen or pale.  ?    Right Sinus: No maxillary sinus tenderness or frontal sinus tenderness.  ?   Left Sinus: No maxillary sinus tenderness or frontal sinus tenderness.  ?   Mouth/Throat:  ?   Lips: Pink. No lesions.  ?   Mouth: Mucous  membranes are moist. No oral lesions.  ?   Pharynx: Oropharynx is clear. Uvula midline. No posterior oropharyngeal erythema or uvula swelling.  ?   Tonsils: No tonsillar exudate. 0 on the right. 0 on the left.  ?Eyes:  ?   General: Lids are normal.     ?   Right eye: No discharge.     ?   Left eye: No discharge.  ?   Extraocular Movements: Extraocular movements intact.  ?   Conjunctiva/sclera: Conjunctivae normal.  ?   Right eye: Right conjunctiva is not injected.  ?   Left eye: Left conjunctiva is not injected.  ?Neck:  ?   Trachea: Trachea and phonation normal.  ?Cardiovascular:  ?   Rate and Rhythm: Normal rate and regular rhythm.  ?   Pulses: Normal pulses.  ?   Heart sounds: Normal heart sounds. No murmur heard. ?  No friction rub. No gallop.  ?Pulmonary:  ?   Effort: Pulmonary effort is normal. No accessory muscle usage, prolonged expiration or respiratory distress.  ?   Breath sounds: Normal breath sounds. No stridor, decreased air movement or transmitted upper airway sounds. No decreased breath sounds, wheezing, rhonchi or rales.  ?Chest:  ?   Chest wall: No tenderness.  ?Musculoskeletal:     ?   General: Normal range of motion.  ?   Cervical back: Normal range of motion and neck supple. Normal range of motion.  ?Lymphadenopathy:  ?   Cervical: No cervical adenopathy.  ?Skin: ?   General: Skin is warm and dry.  ?   Findings: No erythema or rash.  ?Neurological:  ?   General: No focal deficit present.  ?   Mental Status: He is alert and oriented to person, place, and time.  ?Psychiatric:     ?   Mood and Affect: Mood normal.     ?   Behavior: Behavior normal.  ? ? ?Visual Acuity ?Right Eye Distance:   ?Left Eye Distance:   ?Bilateral Distance:   ? ?Right Eye Near:   ?Left Eye Near:    ?Bilateral  Near:    ? ?[facility] Couse / Diagnostics / Procedures:  ?  ?EKG ? ?Radiology ?No results found. ? ?Procedures ?Procedures (including critical care time) ? ?UC Diagnoses / Final Clinical Impressions(s)   ?I have reviewed

## 2021-10-25 NOTE — ED Triage Notes (Signed)
Pt states last night he noticed blood drainage coming from right ear. Pt denies having any pain, changes in hearing and no changes to balance. There is bloody drainage to ear canal. ?

## 2021-10-25 NOTE — Discharge Instructions (Addendum)
For the bleeding in your right ear, I believe this may be related to possible eczema or dry skin.  Because it is not hurting right now, I do believe that nothing is needed at this time.  That being said, if you begin to have pain in your right ear that I recommend that she begin using Ciprodex eardrops which contains an antibiotic and a steroid to calm your down.  This is significant resolve your pain should appear.  Please do not feel that you need to begin this eardrop unless you are having pain. ? ?To address the numbness, tinnitus and pressure sensation on the left side of your face and ear, I provided you with a prescription for methylprednisolone in the form of a Dosepak.  Please take 1 row tablets with a heavy, fatty meal preferably first thing in the morning.  I believe that you still have time to get in your first dose today if you pick it up soon. ? ?Thank you for visiting urgent care today.  I appreciate your waiting and I also appreciate the opportunity to participate in your care. ?

## 2021-12-05 DIAGNOSIS — H9312 Tinnitus, left ear: Secondary | ICD-10-CM | POA: Diagnosis not present

## 2022-04-16 ENCOUNTER — Ambulatory Visit (INDEPENDENT_AMBULATORY_CARE_PROVIDER_SITE_OTHER): Payer: 59 | Admitting: Family Medicine

## 2022-04-16 ENCOUNTER — Encounter: Payer: Self-pay | Admitting: Family Medicine

## 2022-04-16 VITALS — BP 108/62 | HR 61 | Temp 97.7°F | Ht 70.5 in | Wt 185.6 lb

## 2022-04-16 DIAGNOSIS — Z0000 Encounter for general adult medical examination without abnormal findings: Secondary | ICD-10-CM

## 2022-04-16 DIAGNOSIS — E1165 Type 2 diabetes mellitus with hyperglycemia: Secondary | ICD-10-CM | POA: Diagnosis not present

## 2022-04-16 DIAGNOSIS — Z125 Encounter for screening for malignant neoplasm of prostate: Secondary | ICD-10-CM | POA: Diagnosis not present

## 2022-04-16 LAB — COMPREHENSIVE METABOLIC PANEL
ALT: 16 U/L (ref 0–53)
AST: 14 U/L (ref 0–37)
Albumin: 4.5 g/dL (ref 3.5–5.2)
Alkaline Phosphatase: 49 U/L (ref 39–117)
BUN: 19 mg/dL (ref 6–23)
CO2: 24 mEq/L (ref 19–32)
Calcium: 9.6 mg/dL (ref 8.4–10.5)
Chloride: 105 mEq/L (ref 96–112)
Creatinine, Ser: 1.17 mg/dL (ref 0.40–1.50)
GFR: 72.59 mL/min (ref >60.00)
Glucose, Bld: 113 mg/dL — ABNORMAL HIGH (ref 70–99)
Potassium: 4.1 mEq/L (ref 3.5–5.1)
Sodium: 139 mEq/L (ref 135–145)
Total Bilirubin: 0.7 mg/dL (ref 0.2–1.2)
Total Protein: 7.1 g/dL (ref 6.0–8.3)

## 2022-04-16 LAB — LIPID PANEL
Cholesterol: 220 mg/dL — ABNORMAL HIGH (ref 0–200)
HDL: 42.9 mg/dL (ref >39.00)
LDL Cholesterol: 145 mg/dL — ABNORMAL HIGH (ref 0–99)
NonHDL: 177.58
Total CHOL/HDL Ratio: 5
Triglycerides: 165 mg/dL — ABNORMAL HIGH (ref 0.0–149.0)
VLDL: 33 mg/dL (ref 0.0–40.0)

## 2022-04-16 LAB — CBC
HCT: 43 % (ref 39.0–52.0)
Hemoglobin: 14.4 g/dL (ref 13.0–17.0)
MCHC: 33.6 g/dL (ref 30.0–36.0)
MCV: 86.8 fl (ref 78.0–100.0)
Platelets: 230 10*3/uL (ref 150.0–400.0)
RBC: 4.96 Mil/uL (ref 4.22–5.81)
RDW: 14.6 % (ref 11.5–15.5)
WBC: 7.1 10*3/uL (ref 4.0–10.5)

## 2022-04-16 LAB — HEMOGLOBIN A1C: Hgb A1c MFr Bld: 7 % — ABNORMAL HIGH (ref 4.6–6.5)

## 2022-04-16 LAB — PSA: PSA: 0.52 ng/mL (ref 0.10–4.00)

## 2022-04-16 NOTE — Progress Notes (Signed)
Chief Complaint  Patient presents with   Annual Exam    CPE- fasting -     Well Male Jeremy Pierce is here for a complete physical.   His last physical was >1 year ago.  Current diet: in general, a "healthy" diet.  Current exercise: walking Weight trend: intentionally decreasing Fatigue out of ordinary? No. Seat belt? Yes.   Advanced directive? No  Health maintenance Shingrix- No Colonoscopy- Yes Tetanus- Yes HIV- Yes Hep C- Yes   Past Medical History:  Diagnosis Date   Ear pain, left 03/2021   per pt was seen by pcp 04-14-2021 note in epic   ED (erectile dysfunction)    History of COVID-19 11/2020   per pt mild symptoms that resolved   Mixed hyperlipidemia    Right knee meniscal tear    Type 2 diabetes mellitus ([place])    Wears glasses       Past Surgical History:  Procedure Laterality Date   COLONOSCOPY  03/01/2021   KNEE ARTHROSCOPY WITH SUBCHONDROPLASTY Right 04/27/2021   Procedure: KNEE ARTHROSCOPY WITH PARTIAL MEDIAL MENISECTOMY;  Surgeon: Nicholes Stairs, MD;  Location: [facility];  Service: Orthopedics;  Laterality: Right;   URETERAL BIOPSY Left 1982   benign growth   WRIST FRACTURE SURGERY Right 1987   closed reduction    Medications  Current Outpatient Medications on File Prior to Visit  Medication Sig Dispense Refill   Cholecalciferol (D3-1000 PO) Take by mouth.     Cyanocobalamin (VITAMIN B 12 PO) Take by mouth daily.      Allergies No Known Allergies  Family History Family History  Problem Relation Age of Onset   Cancer Mother        breast cancer and tongue cancer   Diabetes Mother    Kidney disease Father    Heart disease Father    Cancer Sister        breast   Diabetes Maternal Grandfather    Colon polyps Maternal Grandfather    Colon cancer Neg Hx    Esophageal cancer Neg Hx    Rectal cancer Neg Hx    Stomach cancer Neg Hx     Review of Systems: Constitutional:  no fevers Eye:  no recent significant  change in vision Ear/Nose/Mouth/Throat:  Ears:  no hearing loss Nose/Mouth/Throat:  no complaints of nasal congestion, no sore throat Cardiovascular:  no chest pain Respiratory:  no shortness of breath Gastrointestinal:  no change in bowel habits GU:  Male: negative for dysuria, frequency Musculoskeletal/Extremities:  no joint pain Integumentary (Skin/Breast):  no abnormal skin lesions reported Neurologic:  no headaches Endocrine: No unexpected weight changes Hematologic/Lymphatic:  no abnormal bleeding  Exam BP 108/62   Pulse 61   Temp 97.7 F (36.5 C) (Oral)   Ht 5' 10.5" (1.791 m)   Wt 185 lb 9.6 oz (84.2 kg)   SpO2 98%   BMI 26.25 kg/m  General:  well developed, well nourished, in no apparent distress Skin:  no significant moles, warts, or growths Head:  no masses, lesions, or tenderness Eyes:  pupils equal and round, sclera anicteric without injection Ears:  canals without lesions, TMs shiny without retraction, no obvious effusion, no erythema Nose:  nares patent, mucosa normal Throat/Pharynx:  lips and gingiva without lesion; tongue and uvula midline; non-inflamed pharynx; no exudates or postnasal drainage Neck: neck supple without adenopathy, thyromegaly, or masses Cardiac: RRR, no bruits, no LE edema Lungs:  clear to auscultation, breath sounds equal bilaterally, no respiratory  distress Abdomen: BS+, soft, non-tender, non-distended, no masses or organomegaly noted Rectal: Deferred Musculoskeletal:  symmetrical muscle groups noted without atrophy or deformity Neuro:  gait normal; deep tendon reflexes normal and symmetric Psych: well oriented with normal range of affect and appropriate judgment/insight  Assessment and Plan  Well adult exam - Plan: CBC, Comprehensive metabolic panel, Lipid panel  Type 2 diabetes mellitus with hyperglycemia, without long-term current use of insulin (HCC) - Plan: Hemoglobin A1c  Screening PSA (prostate specific antigen) - Plan: PSA    Well 51 y.o. male. Counseled on diet and exercise. Counseled on risks and benefits of prostate cancer screening with PSA. The patient agrees to undergo testing. Advanced directive form provided today.  Shingrix rec'd.  Flu shot rec'd for mid Oct. Needs to sched eye exam.  Immunizations, labs, and further orders as above. Follow up 6-12 mo. The patient voiced understanding and agreement to the plan.  [place], DO 04/16/22 8:20 AM

## 2022-04-16 NOTE — Patient Instructions (Addendum)
Give [place] 2-3 business days to get the results of your labs back.   Keep the diet clean and stay active.  I recommend getting the flu shot in mid October. This suggestion would change if the CDC comes out with a different recommendation.   Please get me a copy of your advanced directive form at your convenience.   The Shingrix vaccine (for shingles) is a 2 shot series spaced 2-6 months apart. It can make people feel low energy, achy and almost like they have the flu for 48 hours after injection. 1/5 people can have nausea and/or vomiting. Please plan accordingly when deciding on when to get this shot. Call our office for a nurse visit appointment to get this. The second shot of the series is less severe regarding the side effects, but it still lasts 48 hours.   Let us know if you need anything.

## 2022-04-17 ENCOUNTER — Encounter: Payer: Self-pay | Admitting: Family Medicine

## 2022-04-17 MED ORDER — SIMVASTATIN 20 MG PO TABS: 20.0000 mg | ORAL_TABLET | Freq: Every day | ORAL | 1 refills | Status: DC

## 2022-10-12 ENCOUNTER — Other Ambulatory Visit: Payer: Self-pay | Admitting: Family Medicine

## 2022-10-16 ENCOUNTER — Encounter: Payer: Self-pay | Admitting: Family Medicine

## 2022-10-16 ENCOUNTER — Ambulatory Visit: Payer: 59 | Admitting: Family Medicine

## 2022-10-16 ENCOUNTER — Other Ambulatory Visit: Payer: Self-pay | Admitting: Family Medicine

## 2022-10-16 VITALS — BP 108/73 | HR 72 | Temp 98.0°F | Ht 70.5 in | Wt 190.2 lb

## 2022-10-16 DIAGNOSIS — E1165 Type 2 diabetes mellitus with hyperglycemia: Secondary | ICD-10-CM

## 2022-10-16 DIAGNOSIS — E785 Hyperlipidemia, unspecified: Secondary | ICD-10-CM | POA: Diagnosis not present

## 2022-10-16 LAB — MICROALBUMIN / CREATININE URINE RATIO
Creatinine,U: 173.6 mg/dL
Microalb Creat Ratio: 0.4 mg/g (ref 0.0–30.0)
Microalb, Ur: <0.7 mg/dL (ref 0.0–1.9)

## 2022-10-16 LAB — COMPREHENSIVE METABOLIC PANEL
ALT: 21 U/L (ref 0–53)
AST: 18 U/L (ref 0–37)
Albumin: 4.4 g/dL (ref 3.5–5.2)
Alkaline Phosphatase: 54 U/L (ref 39–117)
BUN: 12 mg/dL (ref 6–23)
CO2: 23 mEq/L (ref 19–32)
Calcium: 9.6 mg/dL (ref 8.4–10.5)
Chloride: 105 mEq/L (ref 96–112)
Creatinine, Ser: 1.24 mg/dL (ref 0.40–1.50)
GFR: 67.47 mL/min (ref >60.00)
Glucose, Bld: 193 mg/dL — ABNORMAL HIGH (ref 70–99)
Potassium: 4.3 mEq/L (ref 3.5–5.1)
Sodium: 137 mEq/L (ref 135–145)
Total Bilirubin: 0.6 mg/dL (ref 0.2–1.2)
Total Protein: 6.8 g/dL (ref 6.0–8.3)

## 2022-10-16 LAB — LIPID PANEL
Cholesterol: 198 mg/dL (ref 0–200)
HDL: 39.6 mg/dL (ref >39.00)
LDL Cholesterol: 126 mg/dL — ABNORMAL HIGH (ref 0–99)
NonHDL: 158.48
Total CHOL/HDL Ratio: 5
Triglycerides: 162 mg/dL — ABNORMAL HIGH (ref 0.0–149.0)
VLDL: 32.4 mg/dL (ref 0.0–40.0)

## 2022-10-16 LAB — HEMOGLOBIN A1C: Hgb A1c MFr Bld: 8.6 % — ABNORMAL HIGH (ref 4.6–6.5)

## 2022-10-16 MED ORDER — DAPAGLIFLOZIN PROPANEDIOL 5 MG PO TABS: 5.0000 mg | ORAL_TABLET | Freq: Every day | ORAL | 3 refills | Status: DC

## 2022-10-16 NOTE — Progress Notes (Signed)
Subjective:   Chief Complaint  Patient presents with   Follow-up    6 month    Jeremy Pierce is a 52 y.o. male here for follow-up of diabetes.   Jeremy Pierce does not routinely monitor his sugars.  Patient does not require insulin.   Medications include: diet controlled; did not tolerate metformin due to GI side effects Diet is fair.  Exercise: walking, yard work  Hyperlipidemia Patient presents for dyslipidemia follow up. Currently being treated with Zocor 20 mg/d, Tricor 48 mg/d and compliance with treatment thus far has been good. He denies myalgias. Diet/exercise as above.  The patient is not known to have coexisting coronary artery disease.  No chest pain or shortness of breath.  Past Medical History:  Diagnosis Date   Ear pain, left 03/2021   per pt was seen by pcp 04-14-2021 note in epic   ED (erectile dysfunction)    History of COVID-19 11/2020   per pt mild symptoms that resolved   Mixed hyperlipidemia    Right knee meniscal tear    Type 2 diabetes mellitus ([place])    Wears glasses      Related testing: Retinal exam: Done Pneumovax: done  Objective:  BP 108/73 (BP Location: Left Arm, Patient Position: Sitting, Cuff Size: Normal)   Pulse 72   Temp 98 F (36.7 C) (Oral)   Ht 5' 10.5" (1.791 m)   Wt 190 lb 4 oz (86.3 kg)   SpO2 98%   BMI 26.91 kg/m  General:  Well developed, well nourished, in no apparent distress Skin:  Warm, no pallor or diaphoresis on exposed surface Lungs:  CTAB, no access msc use Cardio:  RRR, no bruits, no LE edema Psych: Age appropriate judgment and insight  Assessment:   Type 2 diabetes mellitus with hyperglycemia, without long-term current use of insulin (HCC) - Plan: Hemoglobin A1c, Lipid panel, Microalbumin / creatinine urine ratio, Comprehensive metabolic panel  Hyperlipidemia, unspecified hyperlipidemia type   Plan:   Chronic, hopefully stable.  He did not tolerate metformin well.  Will consider SGLT2 inhibitor versus GLP-1  agonist.  We did discuss this and he will think about it if A1c not at goal.  Counseled on diet and exercise. Chronic, stable.  Continue Zocor 20 mg daily, Tricor 48 mg daily. F/u in 6 mo. The patient voiced understanding and agreement to the plan.  [place], DO 10/16/22 8:39 AM

## 2022-10-16 NOTE — Patient Instructions (Signed)
Give us 2-3 business days to get the results of your labs back.  ? ?Keep the diet clean and stay active. ? ?The Shingrix vaccine (for shingles) is a 2 shot series spaced 2-6 months apart. It can make people feel low energy, achy and almost like they have the flu for 48 hours after injection. 1/5 people can have nausea and/or vomiting. Please plan accordingly when deciding on when to get this shot. Call our office for a nurse visit appointment to get this. The second shot of the series is less severe regarding the side effects, but it still lasts 48 hours.  ? ?Let us know if you need anything. ?

## 2022-10-25 ENCOUNTER — Other Ambulatory Visit (HOSPITAL_COMMUNITY): Payer: Self-pay

## 2022-10-25 ENCOUNTER — Telehealth: Payer: Self-pay

## 2022-10-25 MED ORDER — EMPAGLIFLOZIN 25 MG PO TABS: 25.0000 mg | ORAL_TABLET | Freq: Every day | ORAL | 5 refills | Status: DC

## 2022-10-25 NOTE — Telephone Encounter (Signed)
Will send Jardiance.

## 2022-10-25 NOTE — Telephone Encounter (Signed)
Pharmacy Patient Advocate Encounter  Received notification from [employer] that the request for prior authorization for Dapagliflozin Propanediol 5MG  tablets has been denied due to not meeting the prior authorization requirement(s).       Please be advised we currently do not have a Pharmacist to review denials, therefore you will need to process appeals accordingly as needed. Thanks for your support at this time.   You may fax [phone], to appeal.

## 2022-10-25 NOTE — Telephone Encounter (Signed)
Pt notified via mychart

## 2022-10-25 NOTE — Telephone Encounter (Signed)
Pharmacy Patient Advocate Encounter   Received notification that prior authorization for Farxiga 5mg  is required/requested.  Per Test Claim: Non-formulary drug. Alt options: Jardiance   PA submitted on 10/25/22 to (ins) [employer] via CoverMyMeds Key  # [id number] Status is pending

## 2022-10-25 NOTE — Telephone Encounter (Signed)
Thank you :)

## 2022-10-25 NOTE — Telephone Encounter (Signed)
Could you check the status on this ? The PA start date was 10/16/22

## 2022-11-16 ENCOUNTER — Other Ambulatory Visit (HOSPITAL_COMMUNITY): Payer: Self-pay

## 2023-01-15 ENCOUNTER — Encounter: Payer: Self-pay | Admitting: Family Medicine

## 2023-01-15 ENCOUNTER — Ambulatory Visit (INDEPENDENT_AMBULATORY_CARE_PROVIDER_SITE_OTHER): Payer: 59 | Admitting: Family Medicine

## 2023-01-15 ENCOUNTER — Other Ambulatory Visit: Payer: Self-pay | Admitting: Family Medicine

## 2023-01-15 VITALS — BP 121/82 | HR 59 | Temp 98.0°F | Ht 70.5 in | Wt 188.1 lb

## 2023-01-15 DIAGNOSIS — Z7984 Long term (current) use of oral hypoglycemic drugs: Secondary | ICD-10-CM | POA: Diagnosis not present

## 2023-01-15 DIAGNOSIS — E785 Hyperlipidemia, unspecified: Secondary | ICD-10-CM

## 2023-01-15 DIAGNOSIS — E1165 Type 2 diabetes mellitus with hyperglycemia: Secondary | ICD-10-CM | POA: Diagnosis not present

## 2023-01-15 LAB — LIPID PANEL
Cholesterol: 154 mg/dL (ref 0–200)
HDL: 31.1 mg/dL — ABNORMAL LOW (ref >39.00)
NonHDL: 123.04
Total CHOL/HDL Ratio: 5
Triglycerides: 294 mg/dL — ABNORMAL HIGH (ref 0.0–149.0)
VLDL: 58.8 mg/dL — ABNORMAL HIGH (ref 0.0–40.0)

## 2023-01-15 LAB — COMPREHENSIVE METABOLIC PANEL
ALT: 20 U/L (ref 0–53)
AST: 18 U/L (ref 0–37)
Albumin: 4.6 g/dL (ref 3.5–5.2)
Alkaline Phosphatase: 56 U/L (ref 39–117)
BUN: 20 mg/dL (ref 6–23)
CO2: 23 mEq/L (ref 19–32)
Calcium: 9.9 mg/dL (ref 8.4–10.5)
Chloride: 106 mEq/L (ref 96–112)
Creatinine, Ser: 1.28 mg/dL (ref 0.40–1.50)
GFR: 64.83 mL/min (ref >60.00)
Glucose, Bld: 158 mg/dL — ABNORMAL HIGH (ref 70–99)
Potassium: 4.3 mEq/L (ref 3.5–5.1)
Sodium: 139 mEq/L (ref 135–145)
Total Bilirubin: 0.4 mg/dL (ref 0.2–1.2)
Total Protein: 7.1 g/dL (ref 6.0–8.3)

## 2023-01-15 LAB — LDL CHOLESTEROL, DIRECT: Direct LDL: 84 mg/dL

## 2023-01-15 LAB — HEMOGLOBIN A1C: Hgb A1c MFr Bld: 7.8 % — ABNORMAL HIGH (ref 4.6–6.5)

## 2023-01-15 MED ORDER — FENOFIBRATE 48 MG PO TABS: 48.0000 mg | ORAL_TABLET | Freq: Every day | ORAL | 1 refills | Status: DC

## 2023-01-15 MED ORDER — RYBELSUS 3 MG PO TABS: 3.0000 mg | ORAL_TABLET | Freq: Every day | ORAL | 0 refills | Status: DC

## 2023-01-15 MED ORDER — RYBELSUS 7 MG PO TABS: 7.0000 mg | ORAL_TABLET | Freq: Every day | ORAL | 2 refills | Status: DC

## 2023-01-15 NOTE — Patient Instructions (Signed)
Give [place] 2-3 business days to get the results of your labs back.   Keep the diet clean and stay active.  Strong work improving things.  Let us know if you need anything.

## 2023-01-15 NOTE — Progress Notes (Signed)
Subjective:   Chief Complaint  Patient presents with   Follow-up    Jeremy Pierce is a 52 y.o. male here for follow-up of diabetes.   Pt does not monitor his sugars.  Patient does not require insulin.   Medications include: Jardiance 25 mg/d Diet is much healthier.  Exercise: cycling, walking No Cp or SOB.   Hyperlipidemia Patient presents for mixed hyperlipidemia follow up. Currently being treated with Zocor 20 mg daily (has been out of his Tricor for an undisclosed amount of time) and compliance with treatment thus far has been good. He denies myalgias. Diet/exercise as above. The patient is not known to have coexisting coronary artery disease.  Past Medical History:  Diagnosis Date   Ear pain, left 03/2021   per pt was seen by pcp 04-14-2021 note in epic   ED (erectile dysfunction)    History of COVID-19 11/2020   per pt mild symptoms that resolved   Mixed hyperlipidemia    Right knee meniscal tear    Type 2 diabetes mellitus (HCC)    Wears glasses      Related testing: Retinal exam: Done Pneumovax: done  Objective:  BP 121/82 (BP Location: Left Arm, Patient Position: Sitting, Cuff Size: Normal)   Pulse (!) 59   Temp 98 F (36.7 C) (Oral)   Ht 5' 10.5" (1.791 m)   Wt 188 lb 2 oz (85.3 kg)   SpO2 98%   BMI 26.61 kg/m  General:  Well developed, well nourished, in no apparent distress Skin:  Warm, no pallor or diaphoresis on exposed skin Lungs:  CTAB, no access msc use Cardio:  RRR, no bruits, no LE edema Psych: Age appropriate judgment and insight  Assessment:   Type 2 diabetes mellitus with hyperglycemia, without long-term current use of insulin (HCC) - Plan: Hemoglobin A1c  Hyperlipidemia, unspecified hyperlipidemia type - Plan: Lipid panel, Comprehensive metabolic panel   Plan:   Chronic, unstable. Will see what A1c is. Counseled on diet and exercise.  Continue Jardiance 25 mg daily. Chronic, unstable.  Probably needs add back Tricor 48 mg daily.   Will see what his numbers are.  Continue Zocor 20 mg daily. F/u in 3-6 mo. The patient voiced understanding and agreement to the plan.  [name], DO 01/15/23 8:34 AM

## 2023-02-11 ENCOUNTER — Encounter: Payer: Self-pay | Admitting: Family Medicine

## 2023-02-12 ENCOUNTER — Encounter: Payer: Self-pay | Admitting: Family Medicine

## 2023-02-12 ENCOUNTER — Ambulatory Visit: Payer: 59 | Admitting: Family Medicine

## 2023-02-12 VITALS — BP 108/64 | HR 68 | Temp 97.8°F | Ht 70.5 in | Wt 181.2 lb

## 2023-02-12 DIAGNOSIS — Z7984 Long term (current) use of oral hypoglycemic drugs: Secondary | ICD-10-CM

## 2023-02-12 DIAGNOSIS — E1165 Type 2 diabetes mellitus with hyperglycemia: Secondary | ICD-10-CM

## 2023-02-12 MED ORDER — PIOGLITAZONE HCL 30 MG PO TABS
30.0000 mg | ORAL_TABLET | Freq: Every day | ORAL | 1 refills | Status: DC
Start: 2023-02-12 — End: 2023-04-05

## 2023-02-12 NOTE — Patient Instructions (Signed)
Stay on the Jardiance.  Keep the diet clean and stay active.  Let us know if you need anything.

## 2023-02-12 NOTE — Progress Notes (Signed)
Chief Complaint  Patient presents with   Follow-up    New medication is causing nausea and extreme fatigue    Subjective: Patient is a 52 y.o. male here for f/u.  Started Rybelsus for his DM. Made him fatigued, nauseated, and gave him aversion to food. Metformin gave him GI AE's. He is on Jardiance 5 mg/d. Lost 7 lbs in last mo due to AE's. Diet limited from AE's. Has done some cycling. No CP, SOB, abd pain, fevers.   Past Medical History:  Diagnosis Date   Ear pain, left 03/2021   per pt was seen by pcp 04-14-2021 note in epic   ED (erectile dysfunction)    History of COVID-19 11/2020   per pt mild symptoms that resolved   Mixed hyperlipidemia    Right knee meniscal tear    Type 2 diabetes mellitus (HCC)    Wears glasses     Objective: BP 108/64 (BP Location: Left Arm, Patient Position: Sitting, Cuff Size: Normal)   Pulse 68   Temp 97.8 F (36.6 C) (Oral)   Ht 5' 10.5" (1.791 m)   Wt 181 lb 4 oz (82.2 kg)   SpO2 98%   BMI 25.64 kg/m  General: Awake, appears stated age Heart: RRR, no LE edema Lungs: CTAB, no rales, wheezes or rhonchi. No accessory muscle use Psych: Age appropriate judgment and insight, normal affect and mood  Assessment and Plan: Type 2 diabetes mellitus with hyperglycemia, without long-term current use of insulin (HCC)  AE of chronic med. Stop Rybelsus, start Actos 30 mg/d. Cont Jardiance 25 mg/d. Counseled on diet/exercise. F/u as originally scheduled in Sept.  The patient voiced understanding and agreement to the plan.  Jilda Roche [place], DO 02/12/23  3:32 PM

## 2023-03-14 ENCOUNTER — Encounter (INDEPENDENT_AMBULATORY_CARE_PROVIDER_SITE_OTHER): Payer: Self-pay

## 2023-04-04 ENCOUNTER — Other Ambulatory Visit: Payer: Self-pay | Admitting: Family Medicine

## 2023-04-19 ENCOUNTER — Ambulatory Visit (INDEPENDENT_AMBULATORY_CARE_PROVIDER_SITE_OTHER): Payer: 59 | Admitting: Family Medicine

## 2023-04-19 ENCOUNTER — Encounter: Payer: Self-pay | Admitting: Family Medicine

## 2023-04-19 ENCOUNTER — Other Ambulatory Visit: Payer: Self-pay | Admitting: Family Medicine

## 2023-04-19 VITALS — BP 118/72 | HR 60 | Temp 98.8°F | Ht 70.5 in | Wt 184.5 lb

## 2023-04-19 DIAGNOSIS — Z794 Long term (current) use of insulin: Secondary | ICD-10-CM

## 2023-04-19 DIAGNOSIS — Z7984 Long term (current) use of oral hypoglycemic drugs: Secondary | ICD-10-CM

## 2023-04-19 DIAGNOSIS — E1165 Type 2 diabetes mellitus with hyperglycemia: Secondary | ICD-10-CM | POA: Diagnosis not present

## 2023-04-19 DIAGNOSIS — E782 Mixed hyperlipidemia: Secondary | ICD-10-CM | POA: Diagnosis not present

## 2023-04-19 LAB — LIPID PANEL
Cholesterol: 132 mg/dL (ref 0–200)
HDL: 43.1 mg/dL (ref >39.00)
LDL Cholesterol: 72 mg/dL (ref 0–99)
NonHDL: 89.13
Total CHOL/HDL Ratio: 3
Triglycerides: 84 mg/dL (ref 0.0–149.0)
VLDL: 16.8 mg/dL (ref 0.0–40.0)

## 2023-04-19 LAB — COMPREHENSIVE METABOLIC PANEL
ALT: 15 U/L (ref 0–53)
AST: 17 U/L (ref 0–37)
Albumin: 4.4 g/dL (ref 3.5–5.2)
Alkaline Phosphatase: 40 U/L (ref 39–117)
BUN: 18 mg/dL (ref 6–23)
CO2: 25 mEq/L (ref 19–32)
Calcium: 9.4 mg/dL (ref 8.4–10.5)
Chloride: 106 mEq/L (ref 96–112)
Creatinine, Ser: 1.35 mg/dL (ref 0.40–1.50)
GFR: 60.71 mL/min (ref >60.00)
Glucose, Bld: 117 mg/dL — ABNORMAL HIGH (ref 70–99)
Potassium: 4 mEq/L (ref 3.5–5.1)
Sodium: 139 mEq/L (ref 135–145)
Total Bilirubin: 0.6 mg/dL (ref 0.2–1.2)
Total Protein: 6.5 g/dL (ref 6.0–8.3)

## 2023-04-19 LAB — HEMOGLOBIN A1C: Hgb A1c MFr Bld: 7.4 % — ABNORMAL HIGH (ref 4.6–6.5)

## 2023-04-19 MED ORDER — PIOGLITAZONE HCL 45 MG PO TABS: 45.0000 mg | ORAL_TABLET | Freq: Every day | ORAL | 3 refills | Status: DC

## 2023-04-19 NOTE — Patient Instructions (Addendum)
Give [place] 2-3 business days to get the results of your labs back.   Keep the diet clean and stay active.  Foods that may reduce pain: 1) Ginger 2) Blueberries 3) Salmon 4) Pumpkin seeds 5) dark chocolate 6) turmeric 7) tart cherries 8) virgin olive oil 9) chilli peppers 10) mint 11) krill oil  The Shingrix vaccine (for shingles) is a 2 shot series spaced 2-6 months apart. It can make people feel low energy, achy and almost like they have the flu for 48 hours after injection. 1/5 people can have nausea and/or vomiting. Please plan accordingly when deciding on when to get this shot. Call our office for a nurse visit appointment to get this. The second shot of the series is less severe regarding the side effects, but it still lasts 48 hours.   I recommend getting the flu shot in mid October. This suggestion would change if the CDC comes out with a different recommendation.   Let us know if you need anything.  Knee Exercises It is normal to feel mild stretching, pulling, tightness, or discomfort as you do these exercises, but you should stop right away if you feel sudden pain or your pain gets worse.  STRETCHING AND RANGE OF MOTION EXERCISES  These exercises warm up your muscles and joints and improve the movement and flexibility of your knee. These exercises also help to relieve pain, numbness, and tingling. Exercise A: Knee Extension, Prone  Lie on your abdomen on a bed. Place your left / right knee just beyond the edge of the surface so your knee is not on the bed. You can put a towel under your left / right thigh just above your knee for comfort. Relax your leg muscles and allow gravity to straighten your knee. You should feel a stretch behind your left / right knee. Hold this position for 30 seconds. Scoot up so your knee is supported between repetitions. Repeat 2 times. Complete this stretch 3 times per week. Exercise B: Knee Flexion, Active     Lie on your back with both knees  straight. If this causes back discomfort, bend your left / right knee so your foot is flat on the floor. Slowly slide your left / right heel back toward your buttocks until you feel a gentle stretch in the front of your knee or thigh. Hold this position for 30 seconds. Slowly slide your left / right heel back to the starting position. Repeat 2 times. Complete this exercise 3 times per week. Exercise C: Quadriceps, Prone     Lie on your abdomen on a firm surface, such as a bed or padded floor. Bend your left / right knee and hold your ankle. If you cannot reach your ankle or pant leg, loop a belt around your foot and grab the belt instead. Gently pull your heel toward your buttocks. Your knee should not slide out to the side. You should feel a stretch in the front of your thigh and knee. Hold this position for 30 seconds. Repeat 2 times. Complete this stretch 3 times per week. Exercise D: Hamstring, Supine  Lie on your back. Loop a belt or towel over the ball of your left / right foot. The ball of your foot is on the walking surface, right under your toes. Straighten your left / right knee and slowly pull on the belt to raise your leg until you feel a gentle stretch behind your knee. Do not let your left / right knee bend  while you do this. Keep your other leg flat on the floor. Hold this position for 30 seconds. Repeat 2 times. Complete this stretch 3 times per week. STRENGTHENING EXERCISES  These exercises build strength and endurance in your knee. Endurance is the ability to use your muscles for a long time, even after they get tired. Exercise E: Quadriceps, Isometric     Lie on your back with your left / right leg extended and your other knee bent. Put a rolled towel or small pillow under your knee if told by your health care provider. Slowly tense the muscles in the front of your left / right thigh. You should see your kneecap slide up toward your hip or see increased dimpling just  above the knee. This motion will push the back of the knee toward the floor. For 3 seconds, keep the muscle as tight as you can without increasing your pain. Relax the muscles slowly and completely. Repeat for 10 total reps Repeat 2 ti mes. Complete this exercise 3 times per week. Exercise F: Straight Leg Raises - Quadriceps  Lie on your back with your left / right leg extended and your other knee bent. Tense the muscles in the front of your left / right thigh. You should see your kneecap slide up or see increased dimpling just above the knee. Your thigh may even shake a bit. Keep these muscles tight as you raise your leg 4-6 inches (10-15 cm) off the floor. Do not let your knee bend. Hold this position for 3 seconds. Keep these muscles tense as you lower your leg. Relax your muscles slowly and completely after each repetition. 10 total reps. Repeat 2 times. Complete this exercise 3 times per week.  Exercise G: Hamstring Curls     If told by your health care provider, do this exercise while wearing ankle weights. Begin with 5 lb weights (optional). Then increase the weight by 1 lb (0.5 kg) increments. Do not wear ankle weights that are more than 20 lbs to start with. Lie on your abdomen with your legs straight. Bend your left / right knee as far as you can without feeling pain. Keep your hips flat against the floor. Hold this position for 3 seconds. Slowly lower your leg to the starting position. Repeat for 10 reps.  Repeat 2 times. Complete this exercise 3 times per week. Exercise H: Squats (Quadriceps)  Stand in front of a table, with your feet and knees pointing straight ahead. You may rest your hands on the table for balance but not for support. Slowly bend your knees and lower your hips like you are going to sit in a chair. Keep your weight over your heels, not over your toes. Keep your lower legs upright so they are parallel with the table legs. Do not let your hips go lower than  your knees. Do not bend lower than told by your health care provider. If your knee pain increases, do not bend as low. Hold the squat position for 1 second. Slowly push with your legs to return to standing. Do not use your hands to pull yourself to standing. Repeat 2 times. Complete this exercise 3 times per week. Exercise I: Wall Slides (Quadriceps)     Lean your back against a smooth wall or door while you walk your feet out 18-24 inches (46-61 cm) from it. Place your feet hip-width apart. Slowly slide down the wall or door until your knees Repeat 2 times. Complete this exercise every  other day. Exercise K: Straight Leg Raises - Hip Abductors  Lie on your side with your left / right leg in the top position. Lie so your head, shoulder, knee, and hip line up. You may bend your bottom knee to help you keep your balance. Roll your hips slightly forward so your hips are stacked directly over each other and your left / right knee is facing forward. Leading with your heel, lift your top leg 4-6 inches (10-15 cm). You should feel the muscles in your outer hip lifting. Do not let your foot drift forward. Do not let your knee roll toward the ceiling. Hold this position for 3 seconds. Slowly return your leg to the starting position. Let your muscles relax completely after each repetition. 10 total reps. Repeat 2 times. Complete this exercise 3 times per week. Exercise J: Straight Leg Raises - Hip Extensors  Lie on your abdomen on a firm surface. You can put a pillow under your hips if that is more comfortable. Tense the muscles in your buttocks and lift your left / right leg about 4-6 inches (10-15 cm). Keep your knee straight as you lift your leg. Hold this position for 3 seconds. Slowly lower your leg to the starting position. Let your leg relax completely after each repetition. Repeat 2 times. Complete this exercise 3 times per week. Document Released: 05/30/2005 Document Revised: 04/09/2016  Document Reviewed: 05/22/2015 Elsevier Interactive Patient Education  2017 [employer].

## 2023-04-19 NOTE — Progress Notes (Signed)
Subjective:   Chief Complaint  Patient presents with   Follow-up    3 month DM Recheck Cholesterol    Jeremy Pierce is a 52 y.o. male here for follow-up of diabetes.   Chanc does not routinely check his sugars.  Patient does not require insulin.   Medications include: Actos 30 mg/d Jardiance 25 mg/d. Diet is healthy overall.  Exercise: walking, cycling  Hyperlipidemia Patient presents for dyslipidemia follow up. Currently being treated with Zocor 20 mg/d, Tricor 48 mg/d and compliance with treatment thus far has been good. He denies myalgias. Diet/exercise as above.  No Cp or SOB.  The patient is not known to have coexisting coronary artery disease.  R knee pain behind knee cap for several mo. No inj or change in activity. No bruising, redness, swelling, neuro s/s's. Hurts going up/down stairs. Has not tried anything.   Past Medical History:  Diagnosis Date   Ear pain, left 03/2021   per pt was seen by pcp 04-14-2021 note in epic   ED (erectile dysfunction)    History of COVID-19 11/2020   per pt mild symptoms that resolved   Mixed hyperlipidemia    Right knee meniscal tear    Type 2 diabetes mellitus (HCC)    Wears glasses      Related testing: Retinal exam: Done Pneumovax: done  Objective:  BP 118/72 (BP Location: Left Arm, Patient Position: Sitting, Cuff Size: Normal)   Pulse 60   Temp 98.8 F (37.1 C) (Oral)   Ht 5' 10.5" (1.791 m)   Wt 184 lb 8 oz (83.7 kg)   SpO2 98%   BMI 26.10 kg/m  General:  Well developed, well nourished, in no apparent distress Skin:  Warm, no pallor or diaphoresis Lungs:  CTAB, no access msc use Cardio:  RRR, no bruits, no LE edema Musculoskeletal:  no ttp over L knee, nml active/passive ROM, neg patellar app/grind, varus/valgus stress, Lachman's Neuro:  Sensation intact to pinprick on feet Psych: Age appropriate judgment and insight  Assessment:   Type 2 diabetes mellitus with hyperglycemia, without long-term current use  of insulin (HCC)  Mixed hyperlipidemia   Plan:   Chronic, unsure if stable. Prefers to avoid an injection. Did not tolerate metformin or Rybelsus. Cont Actos 30 mg/d, Jardiance 25 mg/d. Counseled on diet and exercise. Chronic, hopefully stable. Cont Zocor 20 mg/d, Tricor 48 mg/d.  Heat, ice, Tylenol, stretches/exercises.  F/u in 3-6 mo. The patient voiced understanding and agreement to the plan.  Jilda Roche [place], DO 04/19/23 8:32 AM

## 2023-04-26 ENCOUNTER — Encounter: Payer: 59 | Admitting: Family Medicine

## 2023-05-06 ENCOUNTER — Other Ambulatory Visit: Payer: Self-pay | Admitting: Family Medicine

## 2023-06-08 ENCOUNTER — Other Ambulatory Visit: Payer: Self-pay | Admitting: Family Medicine

## 2023-07-19 ENCOUNTER — Encounter: Payer: Self-pay | Admitting: Family Medicine

## 2023-07-19 ENCOUNTER — Other Ambulatory Visit: Payer: Self-pay | Admitting: Family Medicine

## 2023-07-19 ENCOUNTER — Ambulatory Visit (INDEPENDENT_AMBULATORY_CARE_PROVIDER_SITE_OTHER): Payer: 59 | Admitting: Family Medicine

## 2023-07-19 VITALS — BP 122/76 | HR 78 | Temp 98.0°F | Resp 16 | Ht 70.0 in | Wt 189.4 lb

## 2023-07-19 DIAGNOSIS — E1165 Type 2 diabetes mellitus with hyperglycemia: Secondary | ICD-10-CM | POA: Diagnosis not present

## 2023-07-19 DIAGNOSIS — Z794 Long term (current) use of insulin: Secondary | ICD-10-CM | POA: Diagnosis not present

## 2023-07-19 DIAGNOSIS — E782 Mixed hyperlipidemia: Secondary | ICD-10-CM

## 2023-07-19 DIAGNOSIS — Z7984 Long term (current) use of oral hypoglycemic drugs: Secondary | ICD-10-CM | POA: Diagnosis not present

## 2023-07-19 LAB — COMPREHENSIVE METABOLIC PANEL
ALT: 19 U/L (ref 0–53)
AST: 18 U/L (ref 0–37)
Albumin: 4.5 g/dL (ref 3.5–5.2)
Alkaline Phosphatase: 44 U/L (ref 39–117)
BUN: 11 mg/dL (ref 6–23)
CO2: 26 meq/L (ref 19–32)
Calcium: 9.4 mg/dL (ref 8.4–10.5)
Chloride: 107 meq/L (ref 96–112)
Creatinine, Ser: 1.19 mg/dL (ref 0.40–1.50)
GFR: 70.51 mL/min (ref >60.00)
Glucose, Bld: 113 mg/dL — ABNORMAL HIGH (ref 70–99)
Potassium: 3.9 meq/L (ref 3.5–5.1)
Sodium: 140 meq/L (ref 135–145)
Total Bilirubin: 0.5 mg/dL (ref 0.2–1.2)
Total Protein: 6.6 g/dL (ref 6.0–8.3)

## 2023-07-19 LAB — LIPID PANEL
Cholesterol: 152 mg/dL (ref 0–200)
HDL: 41.4 mg/dL (ref >39.00)
LDL Cholesterol: 79 mg/dL (ref 0–99)
NonHDL: 110.39
Total CHOL/HDL Ratio: 4
Triglycerides: 156 mg/dL — ABNORMAL HIGH (ref 0.0–149.0)
VLDL: 31.2 mg/dL (ref 0.0–40.0)

## 2023-07-19 LAB — HEMOGLOBIN A1C: Hgb A1c MFr Bld: 7.6 % — ABNORMAL HIGH (ref 4.6–6.5)

## 2023-07-19 MED ORDER — GLYBURIDE 5 MG PO TABS: 5.0000 mg | ORAL_TABLET | Freq: Every day | ORAL | 2 refills | Status: DC

## 2023-07-19 NOTE — Progress Notes (Signed)
Subjective:   Chief Complaint  Patient presents with   Follow-up    Follow up    Jeremy Pierce is a 52 y.o. male here for follow-up of diabetes.   Jeremy Pierce does not routinely ck his sugars.  Patient does not require insulin.   Medications include: Jardiance 25 mg/d, Actos 45 mg/d Diet is improving.  Exercise: some cycling No CP or SOB currently.  L sided CP over past few weeks. No inj or change in activity. Sharp, no SOB. No relation w exertion.   Hyperlipidemia Patient presents for dyslipidemia follow up. Currently being treated with Zocor 20 mg/d, Tricor 48 mg/d and compliance with treatment thus far has been good. He denies myalgias. Diet/exercise as above.  The patient is not known to have coexisting coronary artery disease.  Past Medical History:  Diagnosis Date   Ear pain, left 03/2021   per pt was seen by pcp 04-14-2021 note in epic   ED (erectile dysfunction)    History of COVID-19 11/2020   per pt mild symptoms that resolved   Mixed hyperlipidemia    Right knee meniscal tear    Type 2 diabetes mellitus (HCC)    Wears glasses      Related testing: Retinal exam: Done Pneumovax: done  Objective:  BP 122/76   Pulse 78   Temp 98 F (36.7 C) (Oral)   Resp 16   Ht 5\' 10"  (1.778 m)   Wt 189 lb 6.4 oz (85.9 kg)   SpO2 98%   BMI 27.18 kg/m  General:  Well developed, well nourished, in no apparent distress Lungs:  CTAB, no access msc use Cardio:  RRR, no bruits, no LE edema MSK: mild ttp over L chest wall Psych: Age appropriate judgment and insight  Assessment:   Type 2 diabetes mellitus with hyperglycemia, without long-term current use of insulin (HCC) - Plan: Hemoglobin A1c, Comprehensive metabolic panel, Lipid panel  Mixed hyperlipidemia   Plan:   Chronic, uncontrolled. Cont Jardiance 25 mg/d, Actos 45 mg/d. Counseled on diet and exercise. Chronic, stable. Cont Zocor 20 mg/d, Tricor 48 mg/d.  F/u in 3-6 mo pending the above. The patient voiced  understanding and agreement to the plan.  Jeremy Roche [name], DO 07/19/23 8:56 AM

## 2023-07-19 NOTE — Patient Instructions (Addendum)
Give [place] 2-3 business days to get the results of your labs back.   Keep the diet clean and stay active.  Let us know if you need anything.  Pectoralis Major Rehab Ask your health care provider which exercises are safe for you. Do exercises exactly as told by your health care provider and adjust them as directed. It is normal to feel mild stretching, pulling, tightness, or discomfort as you do these exercises, but you should stop right away if you feel sudden pain or your pain gets worse. Do not begin these exercises until told by your health care provider. Stretching and range of motion exercises These exercises warm up your muscles and joints and improve the movement and flexibility of your shoulder. These exercises can also help to relieve pain, numbness, and tingling. Exercise A: Pendulum  Stand near a wall or a surface that you can hold onto for balance. Bend at the waist and let your left / right arm hang straight down. Use your other arm to keep your balance. Relax your arm and shoulder muscles, and move your hips and your trunk so your left / right arm swings freely. Your arm should swing because of the motion of your body, not because you are using your arm or shoulder muscles. Keep moving so your arm swings in the following directions, as told by your health care provider: Side to side. Forward and backward. In clockwise and counterclockwise circles. Slowly return to the starting position. Repeat 2 times. Complete this exercise 3 times per week. Exercise B: Abduction, standing Stand and hold a broomstick, a cane, or a similar object. Place your hands a little more than shoulder-width apart on the object. Your left / right hand should be palm-up, and your other hand should be palm-down. While keeping your elbow straight and your shoulder muscles relaxed, push the stick across your body toward your left / right side. Raise your left / right arm to the side of your body and then over your  head until you feel a stretch in your shoulder. Stop when you reach the angle that is recommended by your health care provider. Avoid shrugging your shoulder while you raise your arm. Keep your shoulder blade tucked down toward the middle of your spine. Hold for 10 seconds. Slowly return to the starting position. Repeat 2 times. Complete this exercise 3 times per week. Exercise C: Wand flexion, supine  Lie on your back. You may bend your knees for comfort. Hold a broomstick, a cane, or a similar object so that your hands are about shoulder-width apart on the object. Your palms should face toward your feet. Raise your left / right arm in front of your face, then behind your head (toward the floor). Use your other hand to help you do this. Stop when you feel a gentle stretch in your shoulder, or when you reach the angle that is recommended by your health care provider. Hold for 3 seconds. Use the broomstick and your other arm to help you return your left / right arm to the starting position. Repeat 2 times. Complete this exercise 3 times per week. Exercise D: Wand shoulder external rotation Stand and hold a broomstick, a cane, or a similar object so your hands are about shoulder-width apart on the object. Start with your arms hanging down, then bend both elbows to an "L" shape (90 degrees). Keep your left / right elbow at your side. Use your other hand to push the stick so your  left / right forearm moves away from your body, out to your side. Keep your left / right elbow bent to 90 degrees and keep it against your side. Stop when you feel a gentle stretch in your shoulder, or when you reach the angle recommended by your health care provider. Hold for 10 seconds. Use the stick to help you return your left / right arm to the starting position. Repeat 2 times. Complete this exercise 3 times per week. Strengthening exercises These exercises build strength and endurance in your shoulder. Endurance  is the ability to use your muscles for a long time, even after your muscles get tired. Exercise E: Scapular protraction, standing Stand so you are facing a wall. Place your feet about one arm-length away from the wall. Place your hands on the wall and straighten your elbows. Keep your hands on the wall as you push your upper back away from the wall. You should feel your shoulder blades sliding forward. Keep your elbows and your head still. If you are not sure that you are doing this exercise correctly, ask your health care provider for more instructions. Hold for 3 seconds. Slowly return to the starting position. Let your muscles relax completely before you repeat this exercise. Repeat 2 times. Complete this exercise 3 times per week. Exercise F: Shoulder blade squeezes  (scapular retraction) Sit with good posture in a stable chair. Do not let your back touch the back of the chair. Your arms should be at your sides with your elbows bent. You may rest your forearms on a pillow if that is more comfortable. Squeeze your shoulder blades together. Bring them down and back. Keep your shoulders level. Do not lift your shoulders up toward your ears. Hold for 3 seconds. Return to the starting position. Repeat 2 times. Complete this exercise 3 times per week. This information is not intended to replace advice given to you by your health care provider. Make sure you discuss any questions you have with your health care provider. Document Released: 07/16/2005 Document Revised: 04/26/2016 Document Reviewed: 04/03/2015 Elsevier Interactive Patient Education  [employer].

## 2023-08-20 ENCOUNTER — Other Ambulatory Visit: Payer: Self-pay | Admitting: Family Medicine

## 2023-09-25 ENCOUNTER — Other Ambulatory Visit: Payer: Self-pay | Admitting: Family Medicine

## 2023-10-14 ENCOUNTER — Ambulatory Visit (INDEPENDENT_AMBULATORY_CARE_PROVIDER_SITE_OTHER): Payer: 59 | Admitting: Family Medicine

## 2023-10-14 ENCOUNTER — Encounter: Payer: Self-pay | Admitting: Family Medicine

## 2023-10-14 VITALS — BP 117/74 | HR 64 | Temp 97.8°F | Resp 16 | Ht 70.0 in | Wt 189.0 lb

## 2023-10-14 DIAGNOSIS — E1165 Type 2 diabetes mellitus with hyperglycemia: Secondary | ICD-10-CM

## 2023-10-14 DIAGNOSIS — Z7984 Long term (current) use of oral hypoglycemic drugs: Secondary | ICD-10-CM

## 2023-10-14 DIAGNOSIS — E782 Mixed hyperlipidemia: Secondary | ICD-10-CM | POA: Diagnosis not present

## 2023-10-14 LAB — LIPID PANEL
Cholesterol: 143 mg/dL (ref 0–200)
HDL: 47.7 mg/dL (ref >39.00)
LDL Cholesterol: 77 mg/dL (ref 0–99)
NonHDL: 95
Total CHOL/HDL Ratio: 3
Triglycerides: 92 mg/dL (ref 0.0–149.0)
VLDL: 18.4 mg/dL (ref 0.0–40.0)

## 2023-10-14 LAB — COMPREHENSIVE METABOLIC PANEL
ALT: 16 U/L (ref 0–53)
AST: 20 U/L (ref 0–37)
Albumin: 4.8 g/dL (ref 3.5–5.2)
Alkaline Phosphatase: 39 U/L (ref 39–117)
BUN: 15 mg/dL (ref 6–23)
CO2: 25 meq/L (ref 19–32)
Calcium: 9.6 mg/dL (ref 8.4–10.5)
Chloride: 106 meq/L (ref 96–112)
Creatinine, Ser: 1.39 mg/dL (ref 0.40–1.50)
GFR: 58.42 mL/min — ABNORMAL LOW (ref >60.00)
Glucose, Bld: 90 mg/dL (ref 70–99)
Potassium: 3.7 meq/L (ref 3.5–5.1)
Sodium: 139 meq/L (ref 135–145)
Total Bilirubin: 0.6 mg/dL (ref 0.2–1.2)
Total Protein: 6.8 g/dL (ref 6.0–8.3)

## 2023-10-14 LAB — HEMOGLOBIN A1C: Hgb A1c MFr Bld: 6.7 % — ABNORMAL HIGH (ref 4.6–6.5)

## 2023-10-14 NOTE — Progress Notes (Signed)
 Subjective:   Chief Complaint  Patient presents with   Diabetes    Here for follow up    Jeremy Pierce is a 53 y.o. male here for follow-up of diabetes.   [name]'s self monitored glucose range is 80-130's.  Patient denies hypoglycemic reactions. He checks his glucose levels intermittently.  Patient does not require insulin.   Medications include: Actos 45 mg/d, Jardiance 25 mg/d, glyburide 5 mg/d Diet is fair.  Exercise: walking, cycling No CP or SOB.   Hyperlipidemia Patient presents for dyslipidemia follow up. Currently being treated with Tricor 48 mg/d, Zocor 20 mg/d and compliance with treatment thus far has been good. He denies myalgias. Diet/exercise as above.  The patient is not known to have coexisting coronary artery disease.  Past Medical History:  Diagnosis Date   Ear pain, left 03/2021   per pt was seen by pcp 04-14-2021 note in epic   ED (erectile dysfunction)    History of COVID-19 11/2020   per pt mild symptoms that resolved   Mixed hyperlipidemia    Right knee meniscal tear    Type 2 diabetes mellitus (HCC)    Wears glasses      Related testing: Retinal exam: Done Pneumovax: done  Objective:  BP 117/74 (BP Location: Right Arm, Patient Position: Sitting)   Pulse 64   Temp 97.8 F (36.6 C) (Oral)   Resp 16   Ht 5\' 10"  (1.778 m)   Wt 189 lb (85.7 kg)   SpO2 98%   BMI 27.12 kg/m  General:  Well developed, well nourished, in no apparent distress Lungs:  CTAB, no access msc use Cardio:  RRR, no bruits, no LE edema Psych: Age appropriate judgment and insight  Assessment:   Type 2 diabetes mellitus with hyperglycemia, without long-term current use of insulin (HCC) - Plan: Comprehensive metabolic panel, Lipid panel, Hemoglobin A1c  Mixed hyperlipidemia   Plan:   Chronic, hopefully stable. For now will cont Jardiance 25 mg/d, Actos 45 mg/d, glyburide 5 mg/d. Counseled on diet and exercise. Chronic, hopefully stable. Cont Zocor 20 mg/d, Tricor  48 mg/d.  F/u in 6 mo. The patient voiced understanding and agreement to the plan.  [name], DO 10/14/23 9:12 AM

## 2023-10-14 NOTE — Patient Instructions (Signed)
 Give [place] 2-3 business days to get the results of your labs back.   Keep the diet clean and stay active.  Let us know if you need anything.

## 2023-10-29 ENCOUNTER — Other Ambulatory Visit: Payer: Self-pay | Admitting: Family Medicine

## 2023-12-07 ENCOUNTER — Other Ambulatory Visit: Payer: Self-pay | Admitting: Family Medicine

## 2024-01-11 ENCOUNTER — Other Ambulatory Visit: Payer: Self-pay | Admitting: Family Medicine

## 2024-02-17 ENCOUNTER — Other Ambulatory Visit: Payer: Self-pay | Admitting: Family Medicine

## 2024-02-21 ENCOUNTER — Encounter: Payer: Self-pay | Admitting: Family Medicine

## 2024-02-21 ENCOUNTER — Ambulatory Visit (INDEPENDENT_AMBULATORY_CARE_PROVIDER_SITE_OTHER): Admitting: Family Medicine

## 2024-02-21 VITALS — BP 110/78 | HR 87 | Temp 97.9°F | Resp 16 | Ht 70.0 in | Wt 189.0 lb

## 2024-02-21 DIAGNOSIS — R109 Unspecified abdominal pain: Secondary | ICD-10-CM

## 2024-02-21 NOTE — Patient Instructions (Addendum)
 Pepcid/famotidine 20 mg 1-2 times daily can help with reflux symptoms.   Make sure you are hydrated and have adequate nutrition for your longer rides. Consider carbs/electrolytes in your hydration pack.   Let us  know if you need anything.

## 2024-02-21 NOTE — Progress Notes (Signed)
 Chief Complaint  Patient presents with   GI Problem    Stomach Pain    Subjective: Patient is a 53 y.o. male here for abd issue.  Patient was cycling 5 days ago.  He cycled in the afternoon with the heat and only had water.  After the ride, he had belching and discomfort in his abdominal region.  No shortness of breath or chest pain.  He is having no issues since then.  Past Medical History:  Diagnosis Date   Ear pain, left 03/2021   per pt was seen by pcp 04-14-2021 note in epic   ED (erectile dysfunction)    History of COVID-19 11/2020   per pt mild symptoms that resolved   Mixed hyperlipidemia    Right knee meniscal tear    Type 2 diabetes mellitus (HCC)    Wears glasses     Objective: BP 110/78 (BP Location: Left Arm, Patient Position: Sitting)   Pulse 87   Temp 97.9 F (36.6 C) (Oral)   Resp 16   Ht 5' 10 (1.778 m)   Wt 189 lb (85.7 kg)   SpO2 98%   BMI 27.12 kg/m  General: Awake, appears stated age Heart: RRR, no LE edema Lungs: CTAB, no rales, wheezes or rhonchi. No accessory muscle use Abd: BS+, S, ND, mild TTP in epigastric region Psych: Age appropriate judgment and insight, normal affect and mood  Assessment and Plan: Abdominal discomfort  Probably dyspepsia from poor titration and dehydration.  Consider Pepcid 20 mg twice daily for lingering symptoms.  Recommended he if he is going to cycle for more than 60 minutes, consider having electrolytes or carbohydrates in his fluid pack.  No concerns for heart disease. The patient voiced understanding and agreement to the plan.  Mabel [place], DO 02/21/24  12:18 PM

## 2024-04-01 ENCOUNTER — Other Ambulatory Visit: Payer: Self-pay | Admitting: Family Medicine

## 2024-04-15 ENCOUNTER — Ambulatory Visit (INDEPENDENT_AMBULATORY_CARE_PROVIDER_SITE_OTHER): Admitting: Family Medicine

## 2024-04-15 ENCOUNTER — Encounter: Payer: Self-pay | Admitting: Family Medicine

## 2024-04-15 VITALS — BP 126/80 | HR 77 | Temp 98.0°F | Resp 16 | Ht 70.0 in | Wt 190.6 lb

## 2024-04-15 DIAGNOSIS — E1165 Type 2 diabetes mellitus with hyperglycemia: Secondary | ICD-10-CM

## 2024-04-15 DIAGNOSIS — Z23 Encounter for immunization: Secondary | ICD-10-CM | POA: Diagnosis not present

## 2024-04-15 DIAGNOSIS — Z7985 Long-term (current) use of injectable non-insulin antidiabetic drugs: Secondary | ICD-10-CM

## 2024-04-15 DIAGNOSIS — E782 Mixed hyperlipidemia: Secondary | ICD-10-CM

## 2024-04-15 DIAGNOSIS — Z1283 Encounter for screening for malignant neoplasm of skin: Secondary | ICD-10-CM

## 2024-04-15 LAB — COMPREHENSIVE METABOLIC PANEL WITH GFR
ALT: 30 U/L (ref 0–53)
AST: 42 U/L — ABNORMAL HIGH (ref 0–37)
Albumin: 4.6 g/dL (ref 3.5–5.2)
Alkaline Phosphatase: 41 U/L (ref 39–117)
BUN: 16 mg/dL (ref 6–23)
CO2: 24 meq/L (ref 19–32)
Calcium: 9.5 mg/dL (ref 8.4–10.5)
Chloride: 107 meq/L (ref 96–112)
Creatinine, Ser: 1.17 mg/dL (ref 0.40–1.50)
GFR: 71.58 mL/min (ref >60.00)
Glucose, Bld: 113 mg/dL — ABNORMAL HIGH (ref 70–99)
Potassium: 4.1 meq/L (ref 3.5–5.1)
Sodium: 139 meq/L (ref 135–145)
Total Bilirubin: 0.7 mg/dL (ref 0.2–1.2)
Total Protein: 6.7 g/dL (ref 6.0–8.3)

## 2024-04-15 LAB — LIPID PANEL
Cholesterol: 135 mg/dL (ref 0–200)
HDL: 38.9 mg/dL — ABNORMAL LOW (ref >39.00)
LDL Cholesterol: 75 mg/dL (ref 0–99)
NonHDL: 95.87
Total CHOL/HDL Ratio: 3
Triglycerides: 103 mg/dL (ref 0.0–149.0)
VLDL: 20.6 mg/dL (ref 0.0–40.0)

## 2024-04-15 LAB — MICROALBUMIN / CREATININE URINE RATIO
Creatinine,U: 89.2 mg/dL
Microalb Creat Ratio: UNDETERMINED mg/g (ref 0.0–30.0)
Microalb, Ur: <0.7 mg/dL

## 2024-04-15 LAB — HEMOGLOBIN A1C: Hgb A1c MFr Bld: 7.7 % — ABNORMAL HIGH (ref 4.6–6.5)

## 2024-04-15 MED ORDER — PIOGLITAZONE HCL 45 MG PO TABS: 45.0000 mg | ORAL_TABLET | Freq: Every day | ORAL | 3 refills | Status: AC

## 2024-04-15 NOTE — Progress Notes (Signed)
 Subjective:   Chief Complaint  Patient presents with   Follow-up    Follow Up    Jeremy Pierce is a 53 y.o. male here for follow-up of diabetes.   Jeremy Pierce's self monitored glucose range is low-mid 100's.  Patient denies hypoglycemic reactions. He checks his glucose levels intermittently.  Patient does not require insulin.   Medications include: Actos  45 mg daily (had run out of), Jardiance  25 mg daily, glyburide  5 mg daily Diet is usually healthy.  Exercise: Cycling, walking, calisthenics  Hyperlipidemia Patient presents for mixed hyperlipidemia follow up. Currently being treated with Zocor  20 mg daily, Tricor  48 mg daily and compliance with treatment thus far has been good. He denies myalgias. Diet/exercise as above. No chest pain or shortness of breath. The patient is not known to have coexisting coronary artery disease.  Past Medical History:  Diagnosis Date   Ear pain, left 03/2021   per pt was seen by pcp 04-14-2021 note in epic   ED (erectile dysfunction)    History of COVID-19 11/2020   per pt mild symptoms that resolved   Mixed hyperlipidemia    Right knee meniscal tear    Type 2 diabetes mellitus (HCC)    Wears glasses      Related testing: Retinal exam: Done Pneumovax: Due  Objective:  BP 126/80 (BP Location: Left Arm, Patient Position: Sitting)   Pulse 77   Temp 98 F (36.7 C) (Oral)   Resp 16   Ht 5' 10 (1.778 m)   Wt 190 lb 9.6 oz (86.5 kg)   SpO2 99%   BMI 27.35 kg/m  General:  Well developed, well nourished, in no apparent distress Lungs:  CTAB, no access msc use Cardio:  RRR, no bruits, no LE edema Psych: Age appropriate judgment and insight  Assessment:   Type 2 diabetes mellitus with hyperglycemia, without long-term current use of insulin (HCC) - Plan: Comprehensive metabolic panel with GFR, Lipid panel, Hemoglobin A1c, Microalbumin / creatinine urine ratio  Mixed hyperlipidemia  Skin cancer screening - Plan: Ambulatory referral to  Dermatology   Plan:   Chronic, stable.  Continue Jardiance  25 mg daily, Actos  45 mg daily, glyburide  5 mg daily.  Counseled on diet and exercise. PCV20 today.  Chronic, stable.  Continue Zocor  20 mg daily, Tricor  48 mg daily. Refer derm. Looks like seb hyperplasia, but he does have fairer skin.  F/u in 6 mo. The patient voiced understanding and agreement to the plan.  Mabel [place], DO 04/15/24 8:06 AM

## 2024-04-15 NOTE — Patient Instructions (Addendum)
Give us 2-3 business days to get the results of your labs back.   Keep the diet clean and stay active.  If you do not hear anything about your referral in the next 1-2 weeks, call our office and ask for an update.  Let us know if you need anything. 

## 2024-04-16 ENCOUNTER — Ambulatory Visit: Payer: Self-pay | Admitting: Family Medicine

## 2024-04-16 DIAGNOSIS — E1165 Type 2 diabetes mellitus with hyperglycemia: Secondary | ICD-10-CM

## 2024-05-04 ENCOUNTER — Other Ambulatory Visit: Payer: Self-pay | Admitting: Family Medicine

## 2024-05-06 ENCOUNTER — Other Ambulatory Visit (INDEPENDENT_AMBULATORY_CARE_PROVIDER_SITE_OTHER)

## 2024-05-06 ENCOUNTER — Ambulatory Visit: Payer: Self-pay | Admitting: Family Medicine

## 2024-05-06 DIAGNOSIS — E1165 Type 2 diabetes mellitus with hyperglycemia: Secondary | ICD-10-CM | POA: Diagnosis not present

## 2024-05-06 LAB — HEMOGLOBIN A1C: Hgb A1c MFr Bld: 7.3 % — ABNORMAL HIGH (ref 4.6–6.5)

## 2024-05-06 LAB — HEPATIC FUNCTION PANEL
ALT: 18 U/L (ref 0–53)
AST: 18 U/L (ref 0–37)
Albumin: 4.6 g/dL (ref 3.5–5.2)
Alkaline Phosphatase: 37 U/L — ABNORMAL LOW (ref 39–117)
Bilirubin, Direct: 0.1 mg/dL (ref 0.0–0.3)
Total Bilirubin: 0.5 mg/dL (ref 0.2–1.2)
Total Protein: 6.8 g/dL (ref 6.0–8.3)

## 2024-05-12 ENCOUNTER — Ambulatory Visit: Admitting: Family Medicine

## 2024-06-03 ENCOUNTER — Telehealth: Payer: Self-pay | Admitting: Family Medicine

## 2024-06-03 ENCOUNTER — Other Ambulatory Visit: Payer: Self-pay

## 2024-06-03 DIAGNOSIS — E1165 Type 2 diabetes mellitus with hyperglycemia: Secondary | ICD-10-CM

## 2024-06-03 NOTE — Telephone Encounter (Signed)
 Ideally would not charge for the accidental one and have the 90 d A1c. Thx.

## 2024-06-03 NOTE — Telephone Encounter (Signed)
 Does pt need another A1c in 90 days or will the one that was accidentally done on 05/06/2024 okay from you wanting It done from the 04/16/24 result note ?

## 2024-06-03 NOTE — Telephone Encounter (Signed)
 Good morning the lab notes say he needs an A1c I just need an order placed please

## 2024-06-03 NOTE — Telephone Encounter (Signed)
 Could you make sure pt isnt charged for A1c next week

## 2024-06-12 ENCOUNTER — Other Ambulatory Visit: Payer: Self-pay | Admitting: Family Medicine

## 2024-06-16 ENCOUNTER — Other Ambulatory Visit (INDEPENDENT_AMBULATORY_CARE_PROVIDER_SITE_OTHER)

## 2024-06-16 ENCOUNTER — Ambulatory Visit: Payer: Self-pay | Admitting: Family Medicine

## 2024-06-16 DIAGNOSIS — E1165 Type 2 diabetes mellitus with hyperglycemia: Secondary | ICD-10-CM

## 2024-06-16 LAB — HEMOGLOBIN A1C: Hgb A1c MFr Bld: 7.3 % — ABNORMAL HIGH (ref 4.6–6.5)

## 2024-06-16 NOTE — Telephone Encounter (Signed)
 A weekly shot to replace the glyburide , which is likely causing the drop, is less likely to cause that sensation.

## 2024-07-16 ENCOUNTER — Other Ambulatory Visit: Payer: Self-pay | Admitting: Family Medicine

## 2024-07-16 ENCOUNTER — Ambulatory Visit: Payer: Self-pay

## 2024-08-18 ENCOUNTER — Encounter: Payer: Self-pay | Admitting: Dermatology

## 2024-08-18 ENCOUNTER — Ambulatory Visit: Admitting: Dermatology

## 2024-08-18 DIAGNOSIS — L821 Other seborrheic keratosis: Secondary | ICD-10-CM | POA: Diagnosis not present

## 2024-08-18 DIAGNOSIS — Z1283 Encounter for screening for malignant neoplasm of skin: Secondary | ICD-10-CM | POA: Diagnosis not present

## 2024-08-18 DIAGNOSIS — W908XXA Exposure to other nonionizing radiation, initial encounter: Secondary | ICD-10-CM | POA: Diagnosis not present

## 2024-08-18 DIAGNOSIS — L578 Other skin changes due to chronic exposure to nonionizing radiation: Secondary | ICD-10-CM

## 2024-08-18 DIAGNOSIS — D1801 Hemangioma of skin and subcutaneous tissue: Secondary | ICD-10-CM

## 2024-08-18 DIAGNOSIS — D229 Melanocytic nevi, unspecified: Secondary | ICD-10-CM

## 2024-08-18 DIAGNOSIS — L814 Other melanin hyperpigmentation: Secondary | ICD-10-CM | POA: Diagnosis not present

## 2024-08-18 DIAGNOSIS — L570 Actinic keratosis: Secondary | ICD-10-CM | POA: Diagnosis not present

## 2024-08-18 NOTE — Progress Notes (Signed)
" ° °  New Patient Visit  Patient (and/or pt guardian) consented to the use of AI-assisted tools for note generation.    Subjective  Jeremy Pierce is a 54 y.o. male who presents for the following:  Total Body Skin Exam (TBSE)  The patient reports he  has spots, moles and lesions to be evaluated, some may be new or changing and the patient may have concern these could be cancer. Patient reports a spot on the nose and on the left cheek  Patient has not previously been treated by a dermatologist. Patient reports he  does not have hx of bx.  Patient admits to family history of skin cancers - father NMSC Patient reports throughout his lifetime has had no sun exposure.  Currently, patient reports if he  has excessive sun exposure, he does apply sunscreen and/or wears protective coverings.  The following portions of the chart were reviewed this encounter and updated as appropriate: medications, allergies, medical history  Review of Systems:  No other skin or systemic complaints except as noted in HPI or Assessment and Plan.  Objective  Well appearing patient in no apparent distress; mood and affect are within normal limits.  A full examination was performed including scalp, head, eyes, ears, nose, lips, neck, chest, axillae, abdomen, back, buttocks, bilateral upper extremities, bilateral lower extremities, hands, feet, fingers, toes, fingernails, and toenails. All findings within normal limits unless otherwise noted below.     Relevant exam findings are noted in the Assessment and Plan.    Assessment & Plan   LENTIGINES, SEBORRHEIC KERATOSES, HEMANGIOMAS - Benign normal skin lesions - Benign-appearing - Call for any changes  MELANOCYTIC NEVI - Tan-brown and/or pink-flesh-colored symmetric macules and papules - Benign appearing on exam today - Observation - Call clinic for new or changing moles - Recommend daily use of broad spectrum spf 30+ sunscreen to sun-exposed areas.   ACTINIC  DAMAGE - Chronic condition, secondary to cumulative UV/sun exposure - diffuse scaly erythematous macules with underlying dyspigmentation - Recommend daily broad spectrum sunscreen SPF 30+ to sun-exposed areas, reapply every 2 hours as needed.  - Staying in the shade or wearing long sleeves, sun glasses (UVA+UVB protection) and wide brim hats (4-inch brim around the entire circumference of the hat) are also recommended for sun protection.  - Call for new or changing lesions.  SKIN CANCER SCREENING PERFORMED TODAY  AK (ACTINIC KERATOSIS) (2) Head - Anterior (Face) (2) - Destruction of lesion - Head - Anterior (Face) (2) Complexity: simple   Destruction method: cryotherapy   Informed consent: discussed and consent obtained   Timeout:  patient name, date of birth, surgical site, and procedure verified Cryotherapy cycles:  2 Outcome: patient tolerated procedure well with no complications   Post-procedure details: wound care instructions given    SKIN EXAM FOR MALIGNANT NEOPLASM   MULTIPLE BENIGN MELANOCYTIC NEVI   SEBORRHEIC KERATOSIS   LENTIGINES   CHERRY ANGIOMA   ACTINIC SKIN DAMAGE    Return in 1 year (on 08/18/2025) for TBSE f/u.  LILLETTE Lyle Cords, am acting as a neurosurgeon for [employer], DO .  Documentation: I have reviewed the above documentation for accuracy and completeness, and I agree with the above.  Delon Lenis, DO      "

## 2024-08-18 NOTE — Patient Instructions (Addendum)

## 2024-10-14 ENCOUNTER — Encounter: Admitting: Family Medicine
